# Patient Record
Sex: Female | Born: 1975
Health system: Southern US, Community
[De-identification: ages and names within clinical notes are randomized; demographics above are authoritative.]

## PROBLEM LIST (undated history)

## (undated) DIAGNOSIS — M1990 Unspecified osteoarthritis, unspecified site: Secondary | ICD-10-CM

## (undated) DIAGNOSIS — Z5189 Encounter for other specified aftercare: Secondary | ICD-10-CM

## (undated) DIAGNOSIS — F419 Anxiety disorder, unspecified: Secondary | ICD-10-CM

## (undated) HISTORY — PX: LUMBAR FUSION: SHX111

## (undated) HISTORY — DX: Encounter for other specified aftercare: Z51.89

## (undated) HISTORY — DX: Unspecified osteoarthritis, unspecified site: M19.90

---

## 1996-10-21 HISTORY — PX: LUMBAR DISC SURGERY: SHX700

## 1998-04-25 ENCOUNTER — Inpatient Hospital Stay (HOSPITAL_COMMUNITY): Admission: AD | Admit: 1998-04-25 | Discharge: 1998-04-25 | Payer: Self-pay | Admitting: Obstetrics and Gynecology

## 1998-05-20 ENCOUNTER — Inpatient Hospital Stay (HOSPITAL_COMMUNITY): Admission: AD | Admit: 1998-05-20 | Discharge: 1998-05-20 | Payer: Self-pay | Admitting: Obstetrics and Gynecology

## 1998-06-09 ENCOUNTER — Inpatient Hospital Stay (HOSPITAL_COMMUNITY): Admission: AD | Admit: 1998-06-09 | Discharge: 1998-06-11 | Payer: Self-pay | Admitting: Obstetrics and Gynecology

## 1998-06-11 ENCOUNTER — Encounter (HOSPITAL_COMMUNITY): Admission: RE | Admit: 1998-06-11 | Discharge: 1998-07-17 | Payer: Self-pay | Admitting: Obstetrics and Gynecology

## 1998-07-19 ENCOUNTER — Other Ambulatory Visit: Admission: RE | Admit: 1998-07-19 | Discharge: 1998-07-19 | Payer: Self-pay | Admitting: Obstetrics and Gynecology

## 1999-01-23 ENCOUNTER — Ambulatory Visit (HOSPITAL_COMMUNITY): Admission: RE | Admit: 1999-01-23 | Discharge: 1999-01-23 | Payer: Self-pay | Admitting: Neurological Surgery

## 1999-01-23 ENCOUNTER — Encounter: Payer: Self-pay | Admitting: Neurological Surgery

## 1999-02-01 ENCOUNTER — Ambulatory Visit (HOSPITAL_COMMUNITY): Admission: RE | Admit: 1999-02-01 | Discharge: 1999-02-01 | Payer: Self-pay | Admitting: Neurological Surgery

## 1999-02-01 ENCOUNTER — Encounter: Payer: Self-pay | Admitting: Neurological Surgery

## 1999-02-15 ENCOUNTER — Ambulatory Visit (HOSPITAL_COMMUNITY): Admission: RE | Admit: 1999-02-15 | Discharge: 1999-02-15 | Payer: Self-pay | Admitting: Neurological Surgery

## 1999-02-15 ENCOUNTER — Encounter: Payer: Self-pay | Admitting: Neurological Surgery

## 1999-03-09 ENCOUNTER — Encounter: Payer: Self-pay | Admitting: Neurological Surgery

## 1999-03-09 ENCOUNTER — Ambulatory Visit (HOSPITAL_COMMUNITY): Admission: RE | Admit: 1999-03-09 | Discharge: 1999-03-09 | Payer: Self-pay | Admitting: Neurological Surgery

## 1999-03-15 ENCOUNTER — Ambulatory Visit (HOSPITAL_COMMUNITY): Admission: EM | Admit: 1999-03-15 | Discharge: 1999-03-15 | Payer: Self-pay | Admitting: Anesthesiology

## 1999-10-02 ENCOUNTER — Other Ambulatory Visit: Admission: RE | Admit: 1999-10-02 | Discharge: 1999-10-02 | Payer: Self-pay | Admitting: Obstetrics and Gynecology

## 1999-12-22 ENCOUNTER — Emergency Department (HOSPITAL_COMMUNITY): Admission: EM | Admit: 1999-12-22 | Discharge: 1999-12-22 | Payer: Self-pay | Admitting: Emergency Medicine

## 1999-12-22 ENCOUNTER — Encounter: Payer: Self-pay | Admitting: Emergency Medicine

## 2000-09-23 ENCOUNTER — Other Ambulatory Visit: Admission: RE | Admit: 2000-09-23 | Discharge: 2000-09-23 | Payer: Self-pay | Admitting: Obstetrics and Gynecology

## 2001-01-25 ENCOUNTER — Encounter: Payer: Self-pay | Admitting: Obstetrics and Gynecology

## 2001-01-25 ENCOUNTER — Observation Stay (HOSPITAL_COMMUNITY): Admission: AD | Admit: 2001-01-25 | Discharge: 2001-01-26 | Payer: Self-pay | Admitting: Obstetrics and Gynecology

## 2001-01-28 ENCOUNTER — Inpatient Hospital Stay (HOSPITAL_COMMUNITY): Admission: AD | Admit: 2001-01-28 | Discharge: 2001-01-28 | Payer: Self-pay | Admitting: Obstetrics and Gynecology

## 2001-03-31 ENCOUNTER — Inpatient Hospital Stay (HOSPITAL_COMMUNITY): Admission: AD | Admit: 2001-03-31 | Discharge: 2001-03-31 | Payer: Self-pay | Admitting: *Deleted

## 2001-04-03 ENCOUNTER — Encounter: Payer: Self-pay | Admitting: Obstetrics and Gynecology

## 2001-04-03 ENCOUNTER — Inpatient Hospital Stay (HOSPITAL_COMMUNITY): Admission: AD | Admit: 2001-04-03 | Discharge: 2001-04-03 | Payer: Self-pay | Admitting: Obstetrics and Gynecology

## 2001-04-04 ENCOUNTER — Inpatient Hospital Stay (HOSPITAL_COMMUNITY): Admission: AD | Admit: 2001-04-04 | Discharge: 2001-04-04 | Payer: Self-pay | Admitting: Obstetrics and Gynecology

## 2001-04-16 ENCOUNTER — Inpatient Hospital Stay (HOSPITAL_COMMUNITY): Admission: AD | Admit: 2001-04-16 | Discharge: 2001-04-18 | Payer: Self-pay | Admitting: Obstetrics & Gynecology

## 2001-04-19 ENCOUNTER — Encounter: Admission: RE | Admit: 2001-04-19 | Discharge: 2001-05-19 | Payer: Self-pay | Admitting: Obstetrics and Gynecology

## 2001-09-10 ENCOUNTER — Other Ambulatory Visit: Admission: RE | Admit: 2001-09-10 | Discharge: 2001-09-10 | Payer: Self-pay | Admitting: Obstetrics and Gynecology

## 2002-09-28 ENCOUNTER — Other Ambulatory Visit: Admission: RE | Admit: 2002-09-28 | Discharge: 2002-09-28 | Payer: Self-pay | Admitting: Obstetrics and Gynecology

## 2002-10-20 ENCOUNTER — Ambulatory Visit (HOSPITAL_COMMUNITY): Admission: RE | Admit: 2002-10-20 | Discharge: 2002-10-20 | Payer: Self-pay | Admitting: Obstetrics and Gynecology

## 2002-10-20 ENCOUNTER — Encounter: Payer: Self-pay | Admitting: Obstetrics and Gynecology

## 2002-11-09 ENCOUNTER — Encounter (INDEPENDENT_AMBULATORY_CARE_PROVIDER_SITE_OTHER): Payer: Self-pay | Admitting: Specialist

## 2002-11-09 ENCOUNTER — Encounter: Payer: Self-pay | Admitting: Surgery

## 2002-11-09 ENCOUNTER — Ambulatory Visit (HOSPITAL_COMMUNITY): Admission: RE | Admit: 2002-11-09 | Discharge: 2002-11-09 | Payer: Self-pay | Admitting: Surgery

## 2003-05-10 ENCOUNTER — Ambulatory Visit (HOSPITAL_COMMUNITY): Admission: RE | Admit: 2003-05-10 | Discharge: 2003-05-10 | Payer: Self-pay | Admitting: Surgery

## 2003-05-10 ENCOUNTER — Encounter: Payer: Self-pay | Admitting: Surgery

## 2004-09-28 ENCOUNTER — Other Ambulatory Visit: Admission: RE | Admit: 2004-09-28 | Discharge: 2004-09-28 | Payer: Self-pay | Admitting: Obstetrics and Gynecology

## 2005-07-22 ENCOUNTER — Encounter: Admission: RE | Admit: 2005-07-22 | Discharge: 2005-07-22 | Payer: Self-pay | Admitting: Obstetrics and Gynecology

## 2005-09-25 ENCOUNTER — Other Ambulatory Visit: Admission: RE | Admit: 2005-09-25 | Discharge: 2005-09-25 | Payer: Self-pay | Admitting: Obstetrics and Gynecology

## 2006-10-16 ENCOUNTER — Encounter: Admission: RE | Admit: 2006-10-16 | Discharge: 2006-10-16 | Payer: Self-pay | Admitting: Obstetrics and Gynecology

## 2006-10-16 ENCOUNTER — Ambulatory Visit (HOSPITAL_COMMUNITY): Admission: RE | Admit: 2006-10-16 | Discharge: 2006-10-16 | Payer: Self-pay | Admitting: Obstetrics and Gynecology

## 2006-10-16 IMAGING — US US SOFT TISSUE HEAD/NECK
1 series · 14 of 25 positions shown · non-contrast
Comparison: none

CLINICAL DATA: Follow-up left thyroid nodule.
THYROID ULTRASOUND:
TECHNIQUE: Ultrasound examination of the thyroid gland and adjacent soft tissue structures was performed.

[Series 1: unknown · 0.09mm/px · 14 of 42 slices shown]
[im 1/42]
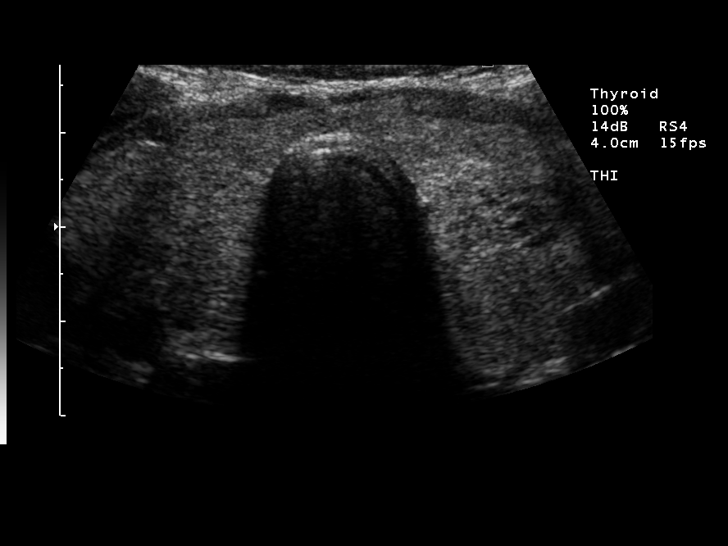
[im 4/42]
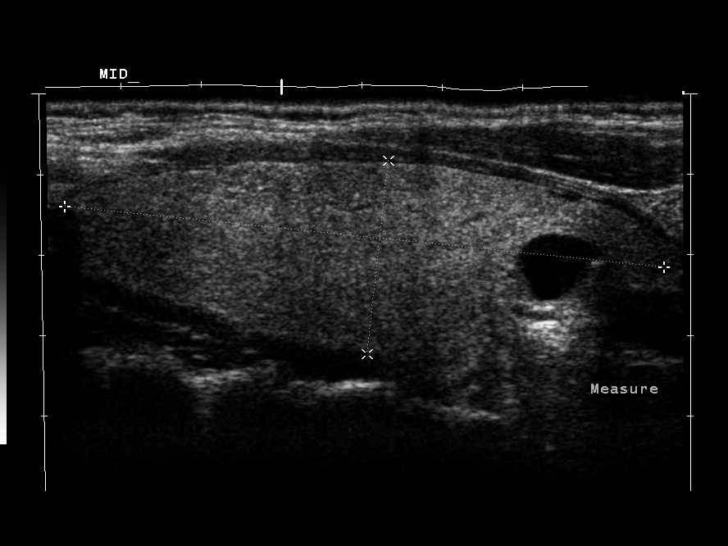
[im 7/42]
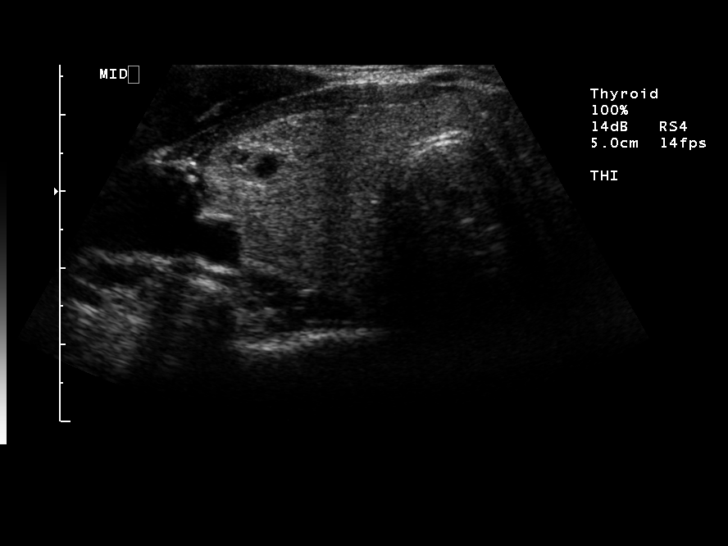
[im 11/42]
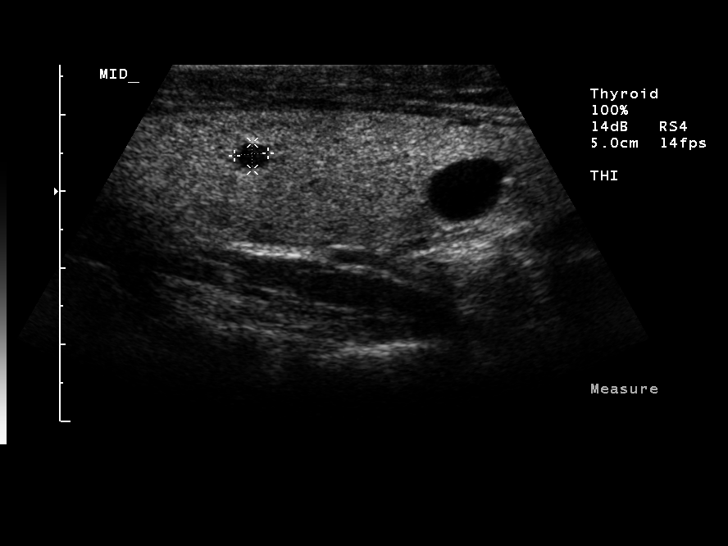
[im 14/42]
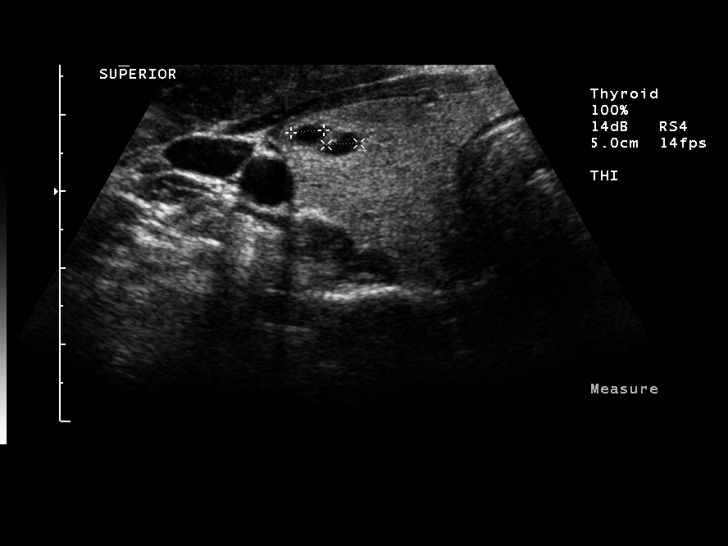
[im 16/42]
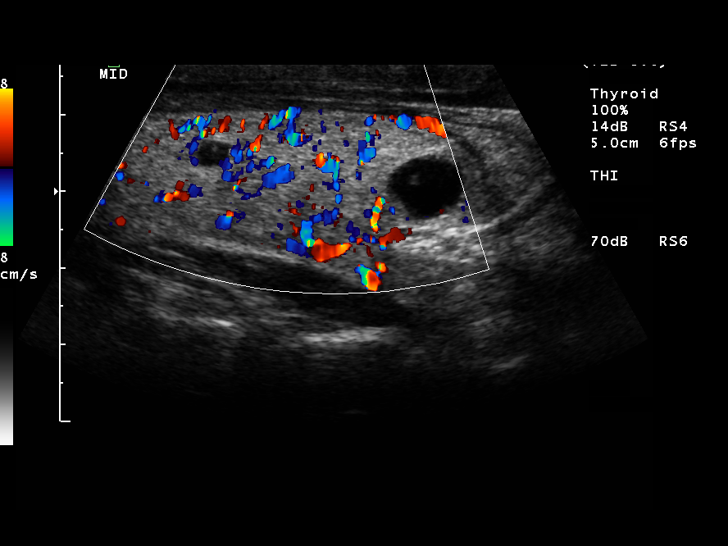
[im 19/42]
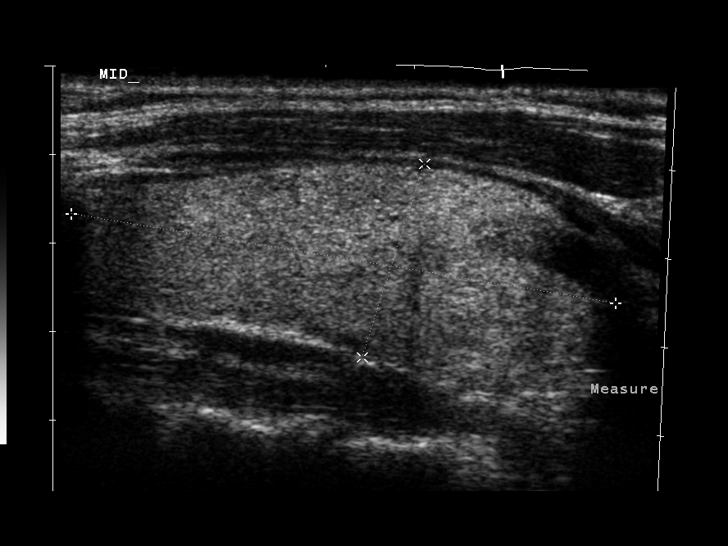
[im 23/42]
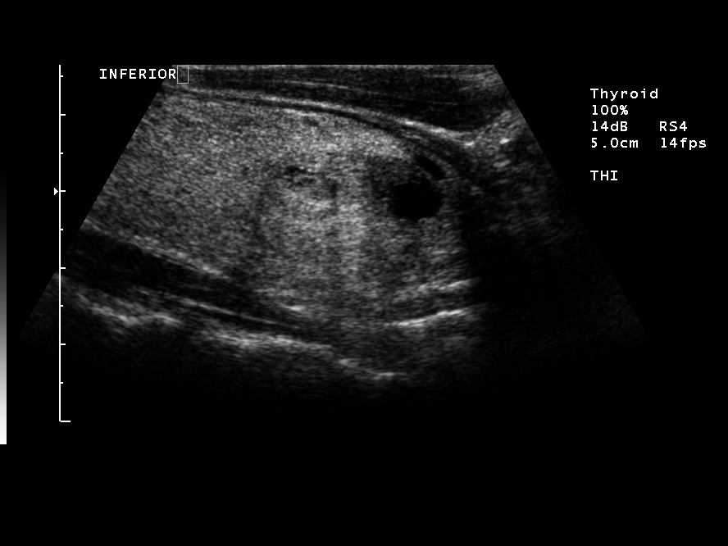
[im 26/42]
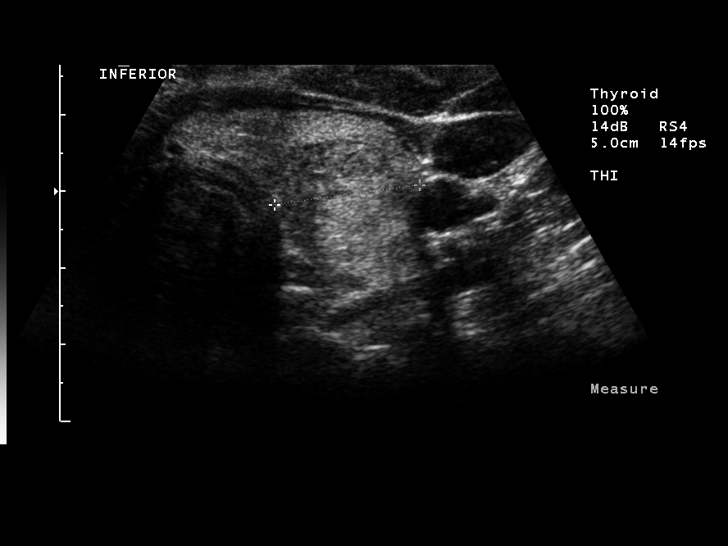
[im 28/42]
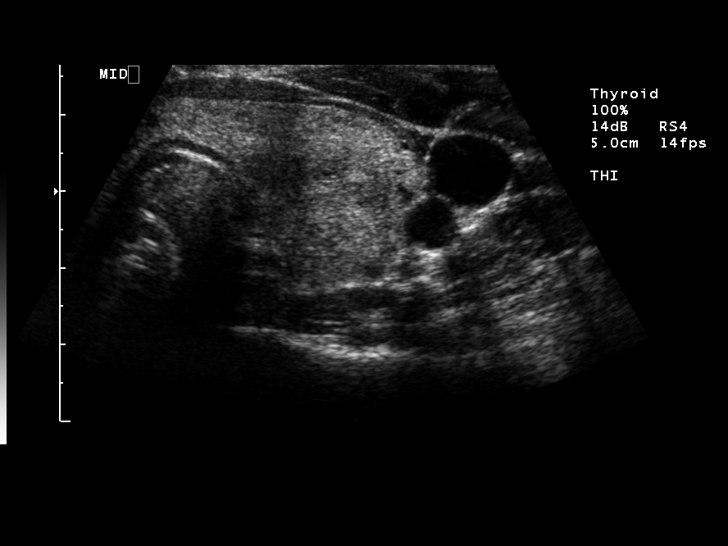
[im 31/42]
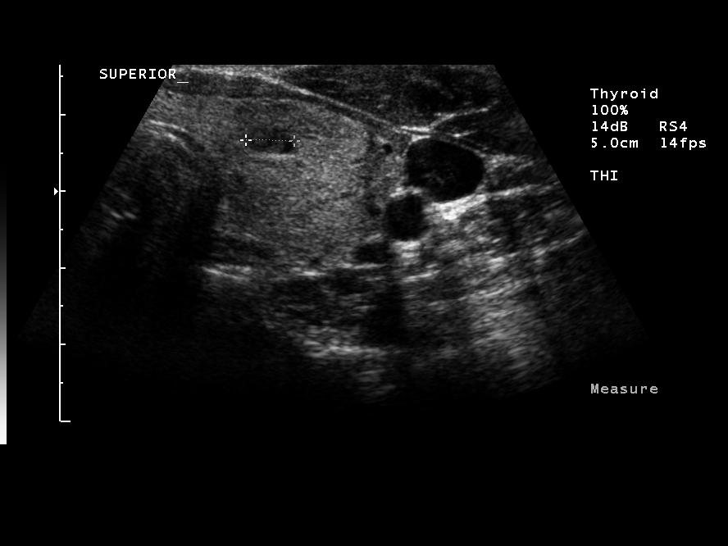
[im 35/42]
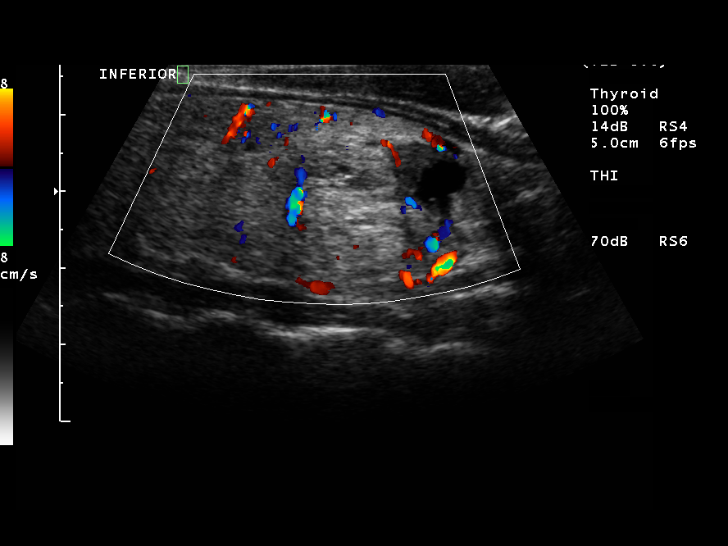
[im 38/42]
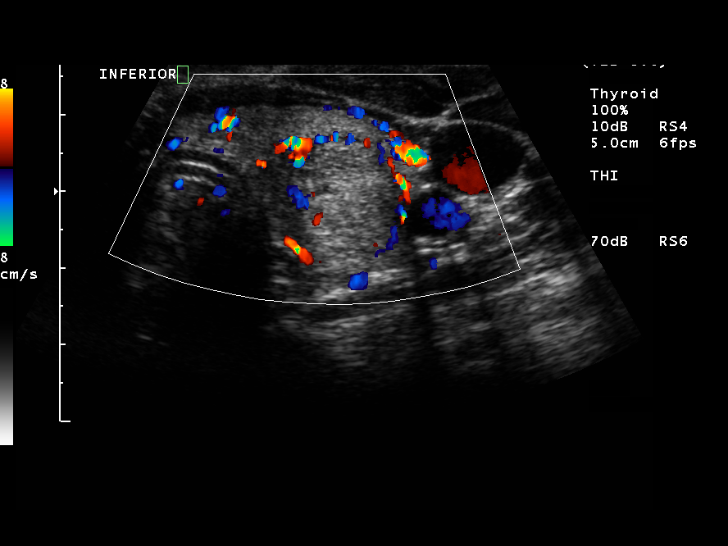
[im 42/42]
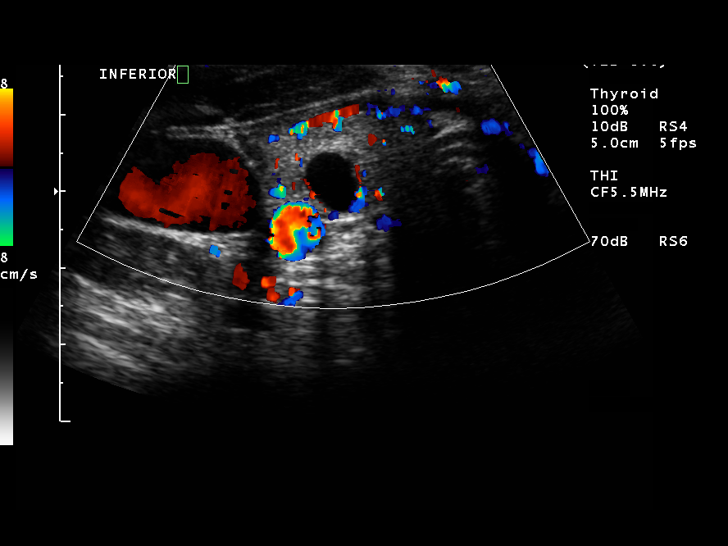

[14 of 25 positions shown; findings below may reference images not displayed]

FINDINGS: Again appreciated is the complex nodule in the inferior pole of the left thyroid lobe which measured 1.3 x 1.6 x 2.6 cm on the [DATE] exam. On today?s examination, the nodule measures 1.9 x 1.9 x 2.9 cm.  The nodule has therefore increased slightly in size.  In the mid aspect of the left thyroid lobe, there is a complex nodule measuring 6 x 6 x 10 mm. Small cystic-like nodules are noted in the right lobe, 2 at the superior pole, 1 measuring 4 x 4 x 5 mm, and the other 4 x 5 x 8 mm.  There is a 4 x 4 mm cystic-like nodule in the medial aspect of the mid right lobe, and also cystic-like nodule in the inferior pole on the right which measures 8 x 8 x 11 mm. The right thyroid lobe measures 7.5 cm in length and 2.4 x 2.5 cm in transverse dimensions.  Left thyroid lobe measures 6.2 cm in length and 2.3 x 2.4 cm in transverse dimensions.  Thyroid isthmus measures 4 mm.
IMPRESSION: Slight increase in size of dominant complex nodule in the left thyroid lobe.

## 2006-10-16 IMAGING — MG MM DIGITAL DIAGNOSTIC BILAT CAD
5 series · 5 of 5 positions shown · non-contrast
Comparison: This is the patient's baseline exam.

DG DIAGNOSTIC BILATERAL
Bilateral CC and MLO view(s) were taken.

BILATERAL BREAST ULTRASOUND
DIGITAL BILATERAL DIAGNOSTIC MAMMOGRAM WITH CAD AND BILATERAL BREAST ULTRASOUND:
CLINICAL DATA: Right breast questioned focal palpable finding in the 9 o'clock location.

[R CC]
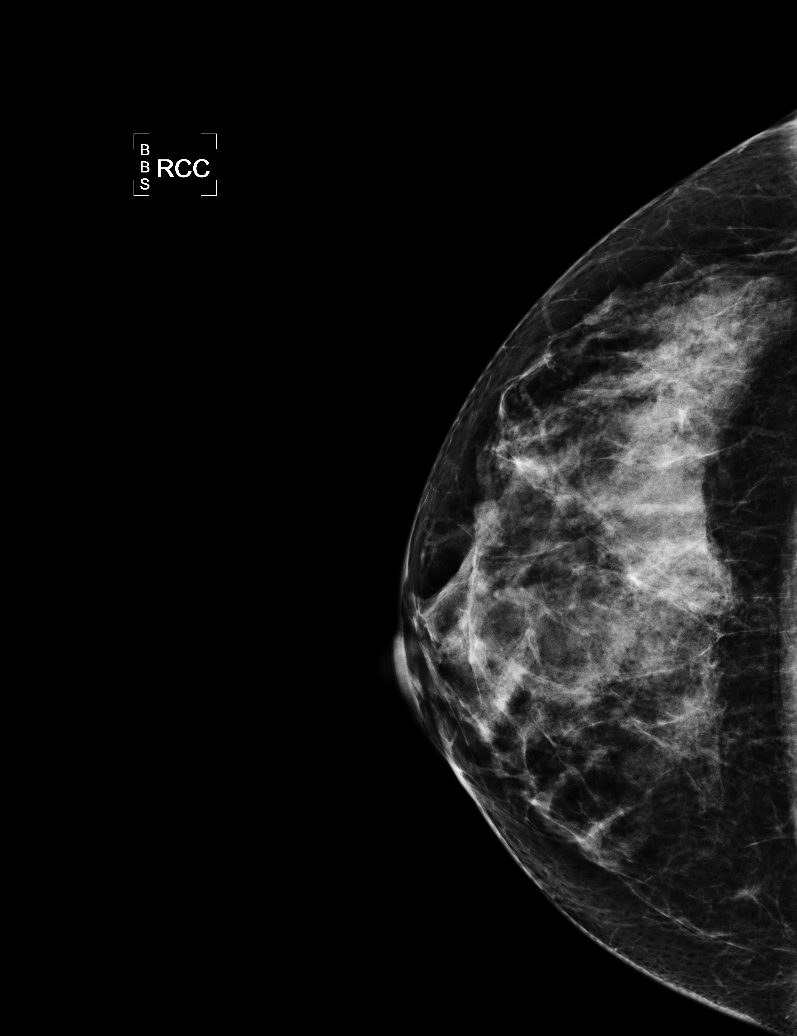

[L CC]
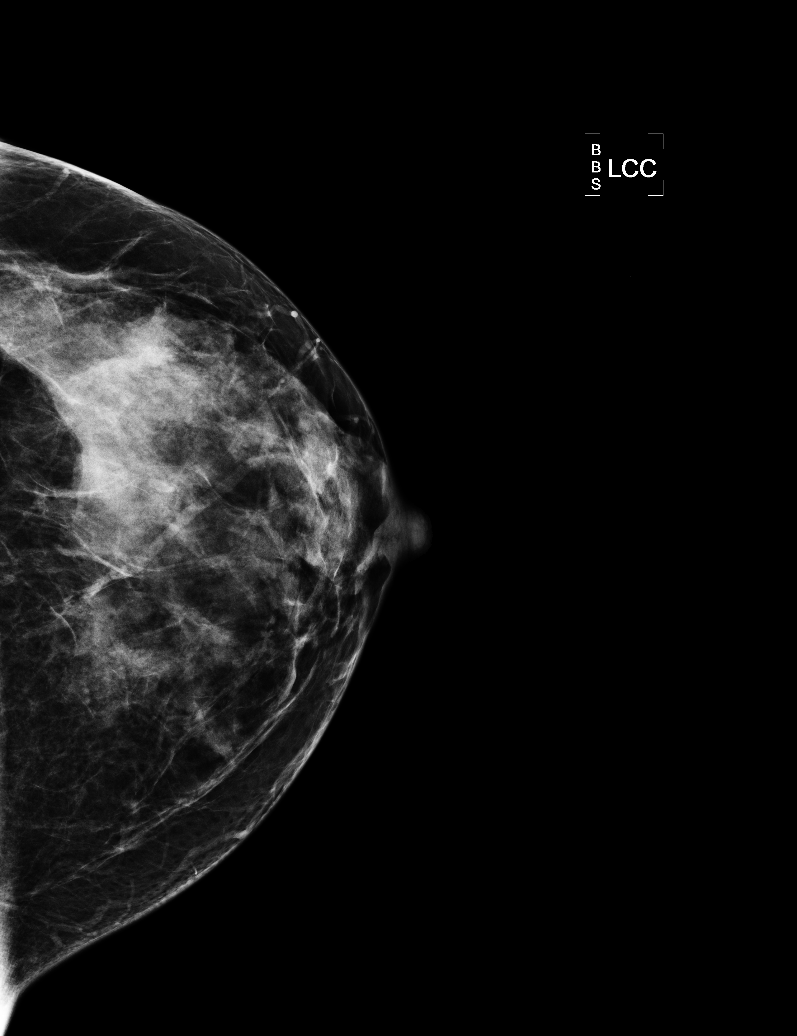

[L MLO]
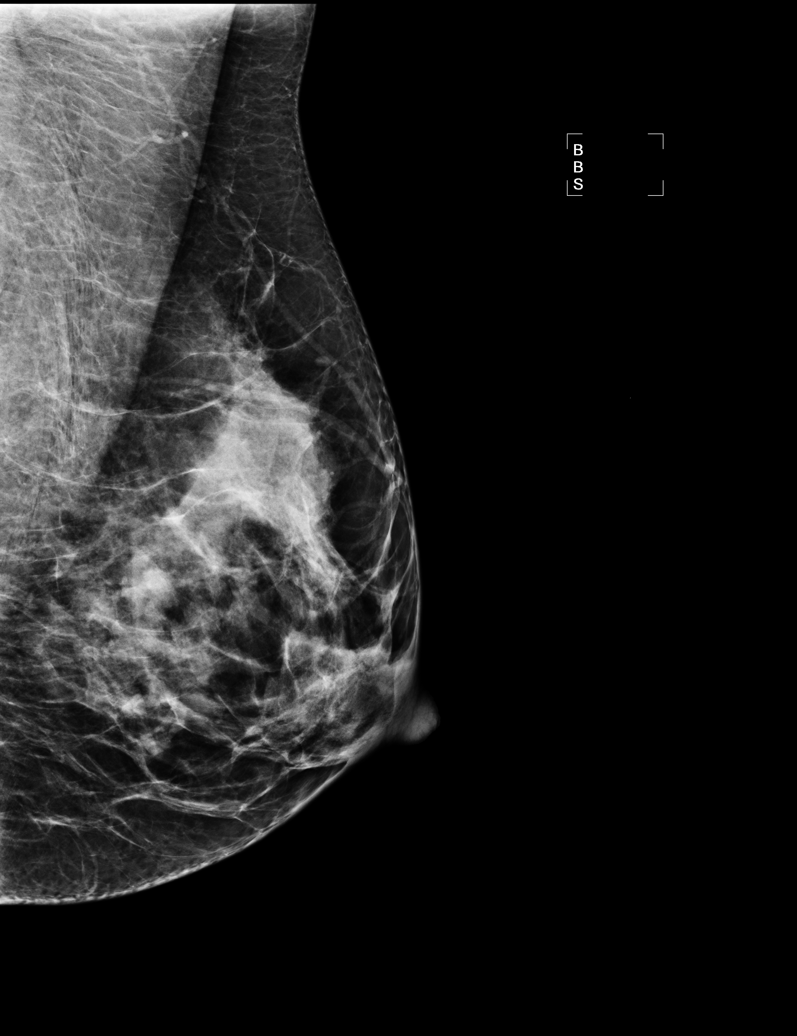

[R MLO (1 of 2)]
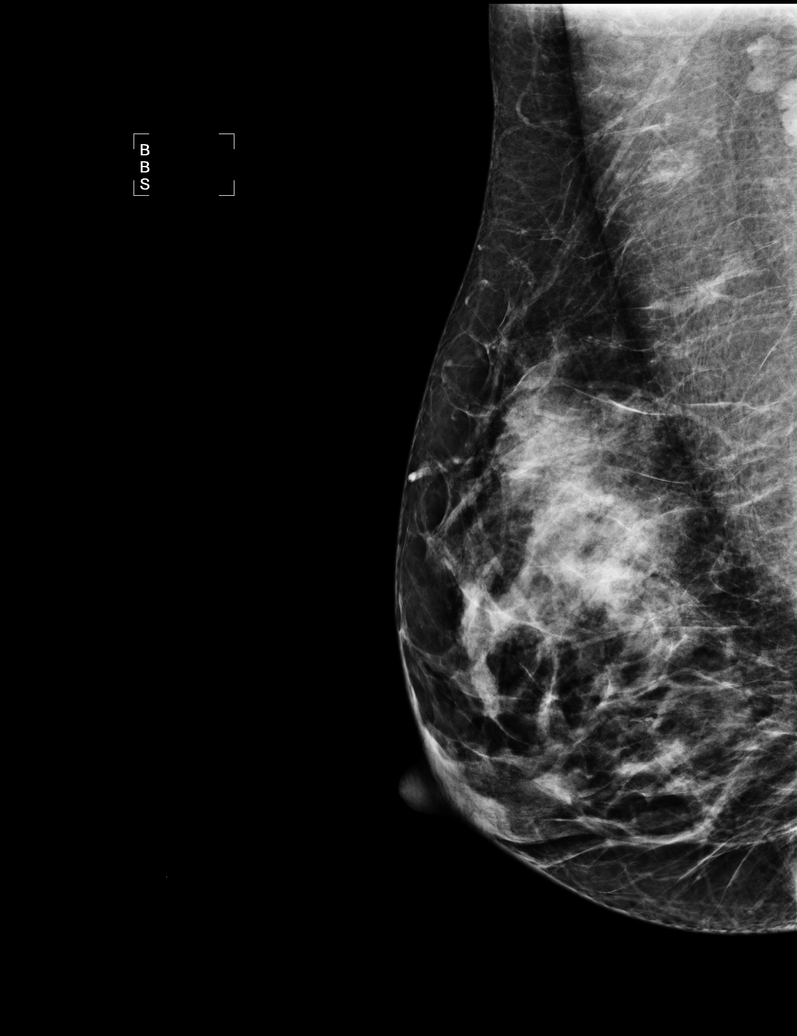

[R MLO (2 of 2)]
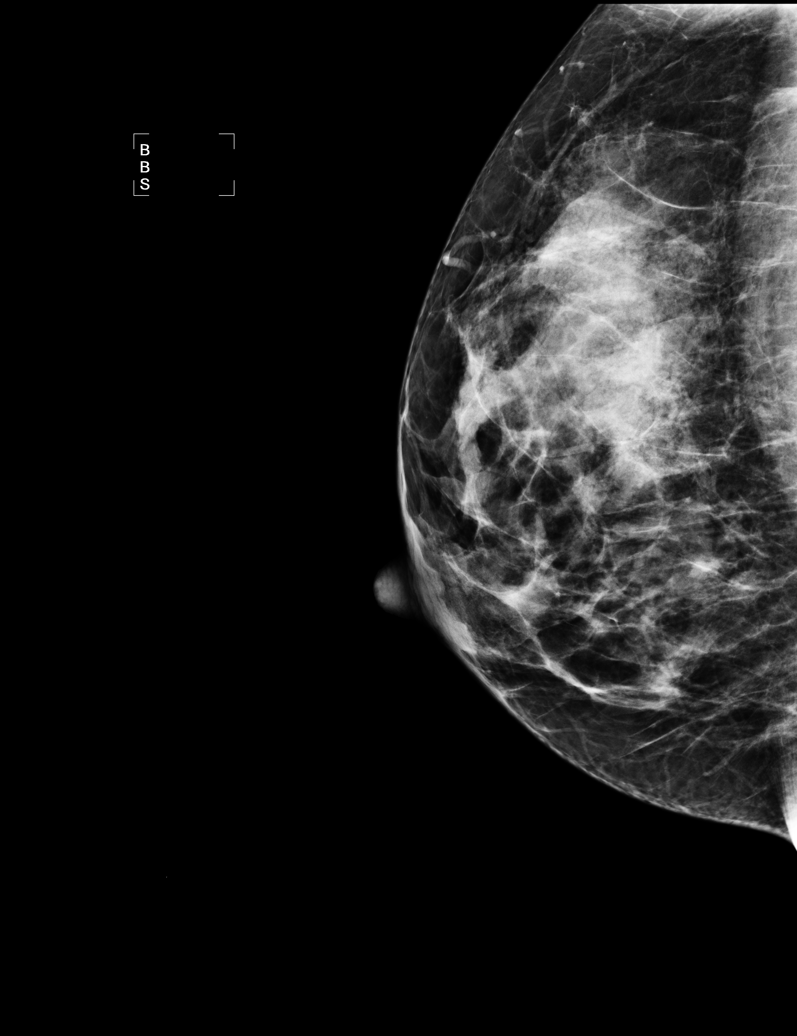

[5 of 5 positions shown; findings below may reference images not displayed]

The breast parenchyma is extremely dense and nodular bilaterally.  This may limit sensitivity of 
mammography.  There is an area of relatively increased nodularity in the left breast at the 3 
o'clock location.  No suspicious mass is definitely identified on the right.

On physical examination, I palpate no distinct abnormality in the right breast at the 9 o'clock 
location; ultrasound of this area is normal.

Ultrasound of the left breast at the 3 o'clock location demonstrates nodular appearing glandular 
tissue but no focal mass or distortion.
IMPRESSION: No mammographic or sonographic evidence for malignancy on the right side in the area of the 
patient's reported questioned palpable finding.

Probably benign asymmetric glandular tissue in the left breast laterally, amenable to 6 month 
diagnostic mammogram follow-up.  If at that time these findings remain stable, likely the patient 
would require no further specific follow-up until the age of 40.

ASSESSMENT: Probably benign - BI-RADS 3

Mammogram of the left breast in 6 months.
ANALYZED BY COMPUTER AIDED DETECTION. ,

## 2006-10-16 IMAGING — MG MM DIGITAL DIAGNOSTIC BILAT CAD
4 series · 4 of 4 positions shown · non-contrast
Comparison: This is the patient's baseline exam.

DG DIAGNOSTIC BILATERAL
Bilateral CC and MLO view(s) were taken.

BILATERAL BREAST ULTRASOUND
DIGITAL BILATERAL DIAGNOSTIC MAMMOGRAM WITH CAD AND BILATERAL BREAST ULTRASOUND:
CLINICAL DATA: Right breast questioned focal palpable finding in the 9 o'clock location.

[R CC (1 of 2)]
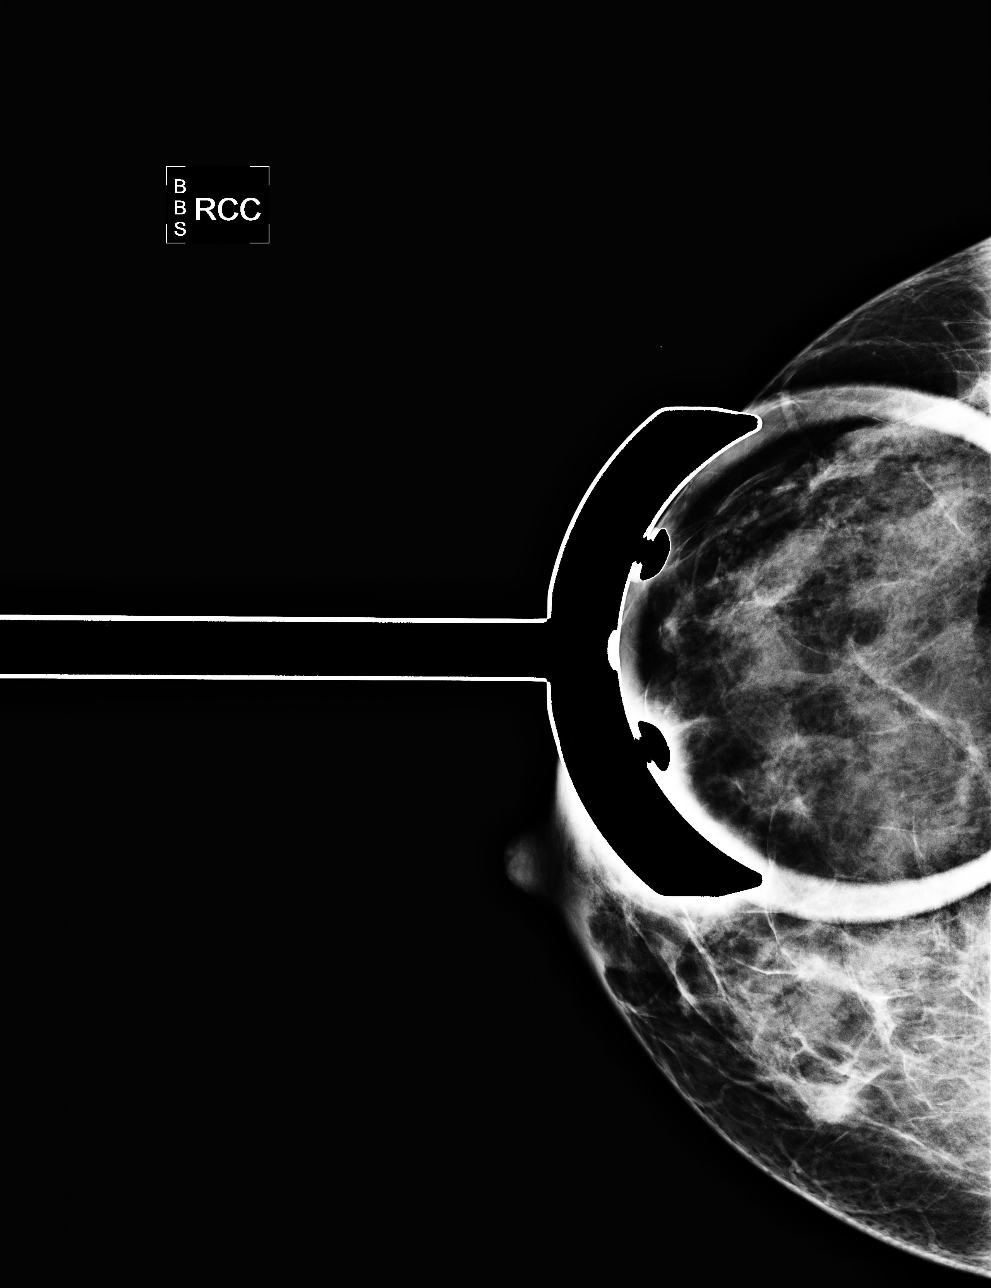

[L MLO]
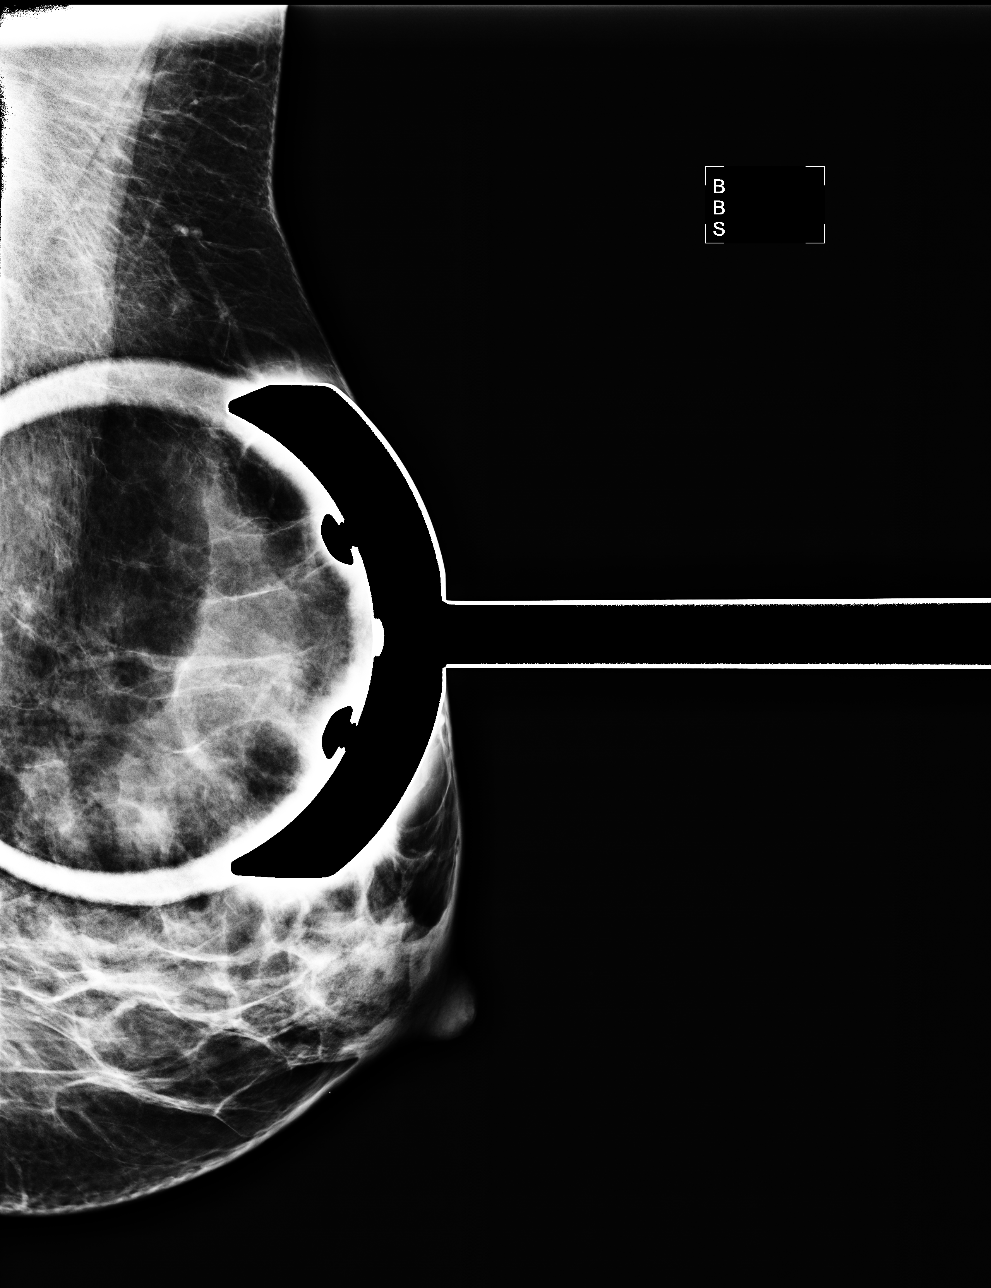

[R CC (2 of 2)]
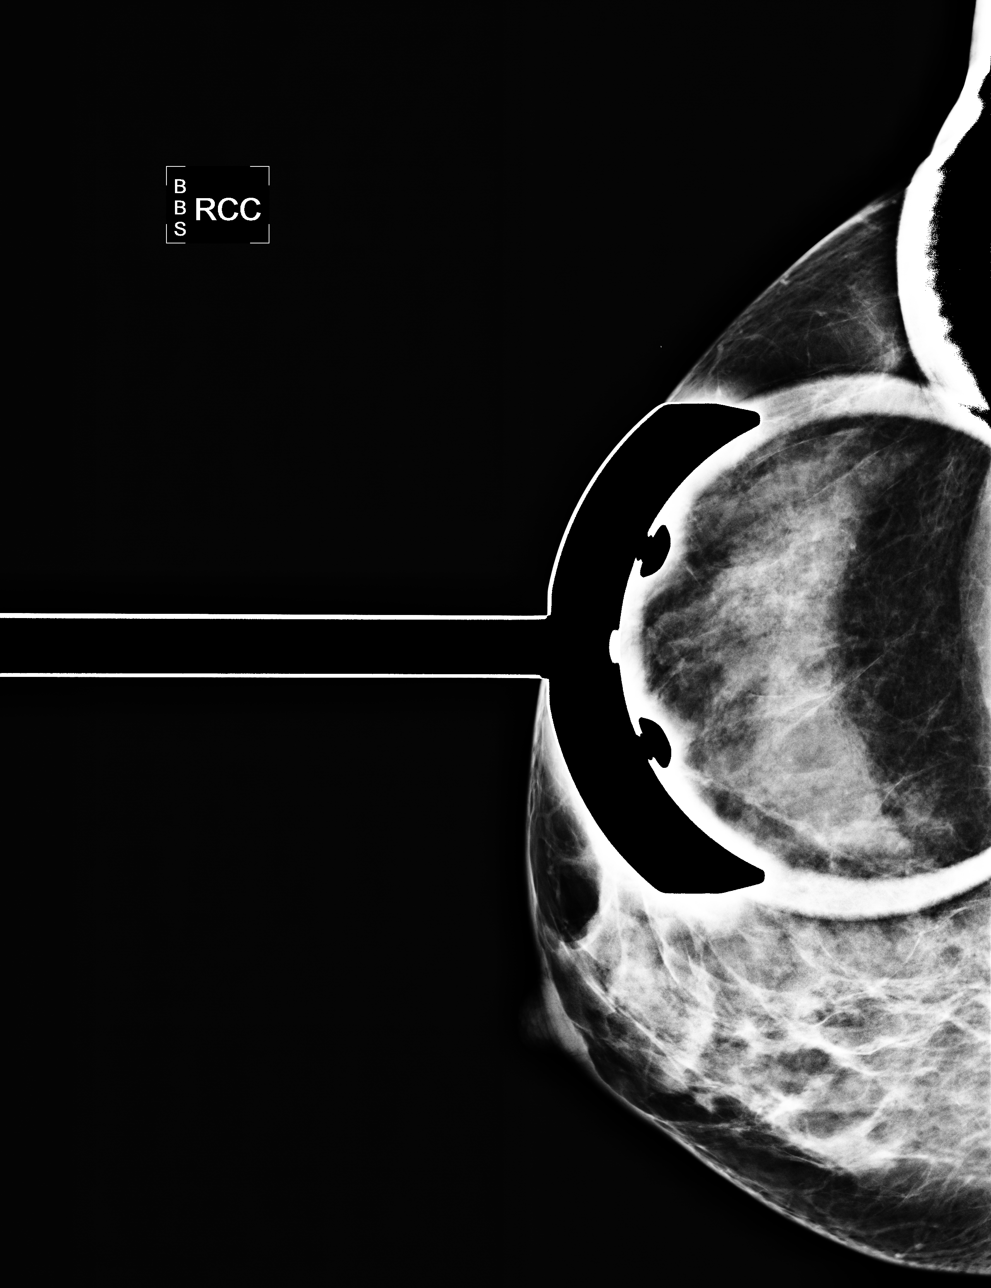

[L CC]
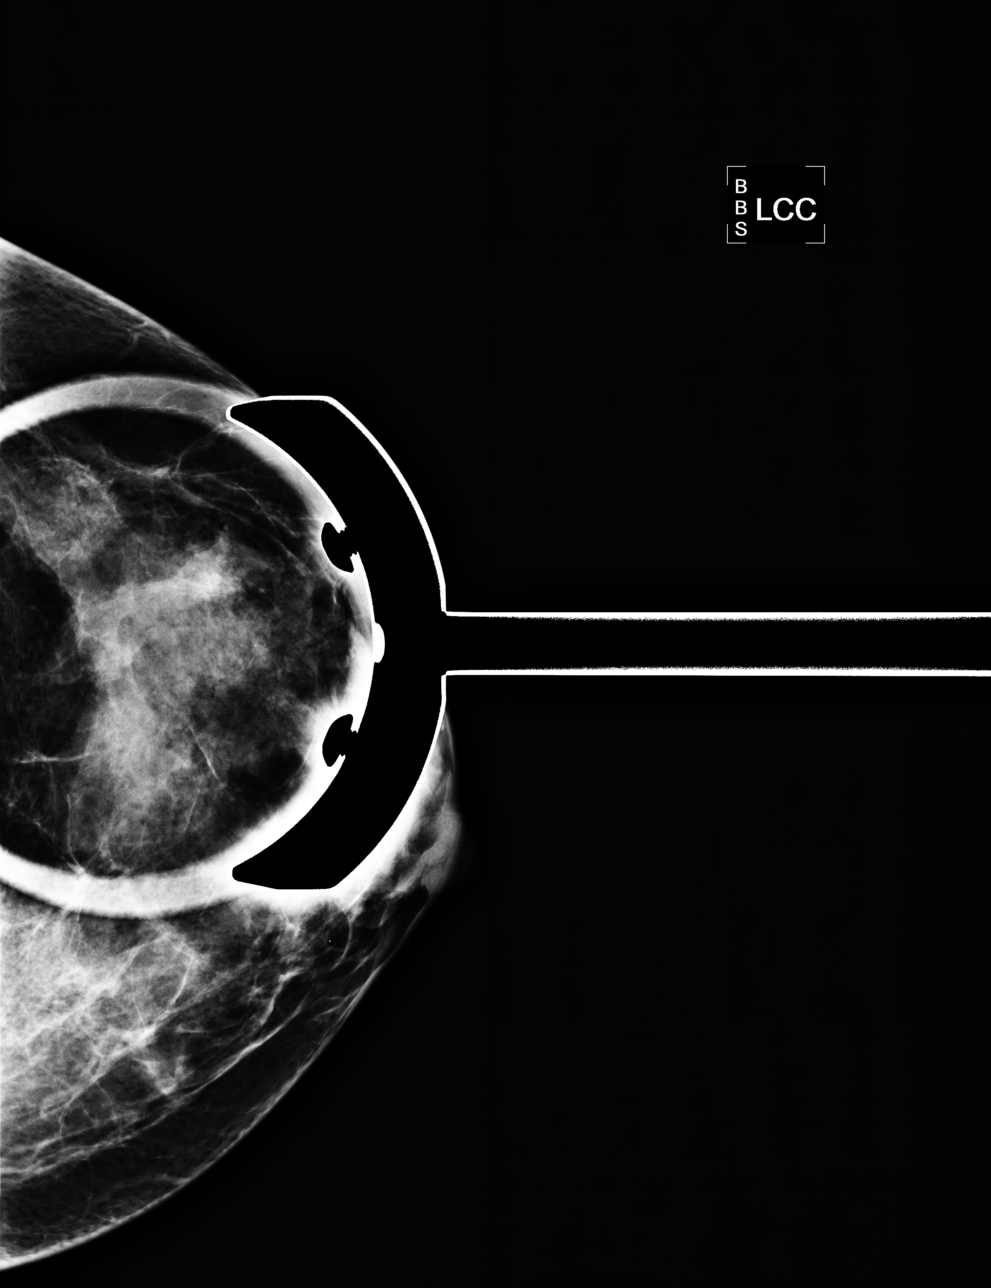

[4 of 4 positions shown; findings below may reference images not displayed]

The breast parenchyma is extremely dense and nodular bilaterally.  This may limit sensitivity of 
mammography.  There is an area of relatively increased nodularity in the left breast at the 3 
o'clock location.  No suspicious mass is definitely identified on the right.

On physical examination, I palpate no distinct abnormality in the right breast at the 9 o'clock 
location; ultrasound of this area is normal.

Ultrasound of the left breast at the 3 o'clock location demonstrates nodular appearing glandular 
tissue but no focal mass or distortion.
IMPRESSION: No mammographic or sonographic evidence for malignancy on the right side in the area of the 
patient's reported questioned palpable finding.

Probably benign asymmetric glandular tissue in the left breast laterally, amenable to 6 month 
diagnostic mammogram follow-up.  If at that time these findings remain stable, likely the patient 
would require no further specific follow-up until the age of 40.

ASSESSMENT: Probably benign - BI-RADS 3

Mammogram of the left breast in 6 months.
ANALYZED BY COMPUTER AIDED DETECTION. ,

## 2007-05-05 ENCOUNTER — Ambulatory Visit: Payer: Self-pay | Admitting: Internal Medicine

## 2007-05-06 ENCOUNTER — Ambulatory Visit: Payer: Self-pay | Admitting: Internal Medicine

## 2007-05-06 IMAGING — CT CT LUMBAR SPINE WITHOUT CONTRAST
1 series · 12 of 14 positions shown, 15 images · non-contrast
Comparison: none

REASON FOR EXAM: pain in mid lumbar at the site of previous 2 surgeries
COMMENTS:

[Series 6: soft tissue · axial · 0.31mm/px · z∈[-1033,-849]mm · 12 of 110 slices shown, 15 images]
[im 9/110  soft-tissue]
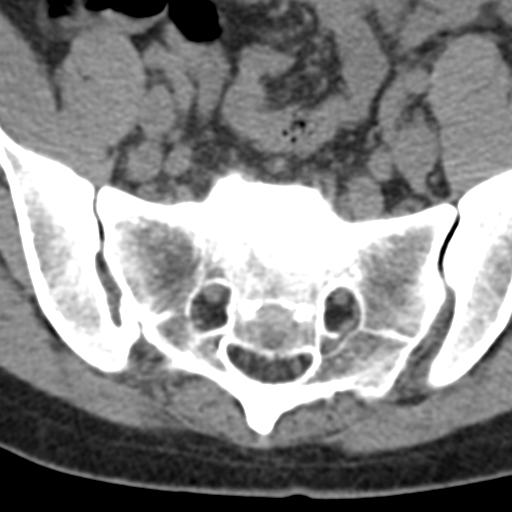
[im 9/110  bone]
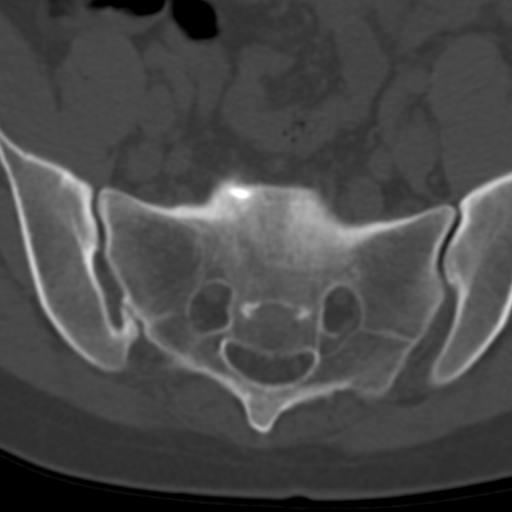
[im 17/110  bone]
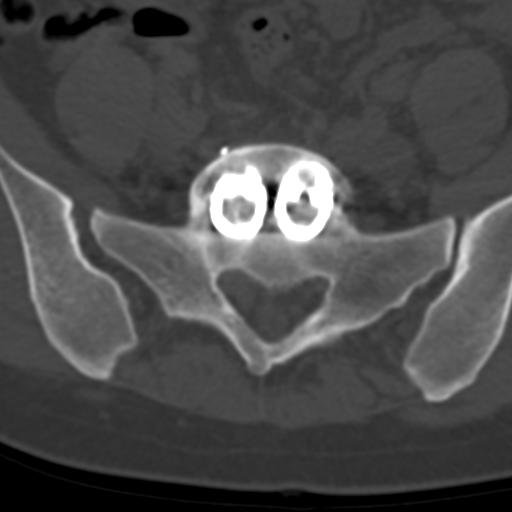
[im 26/110  bone]
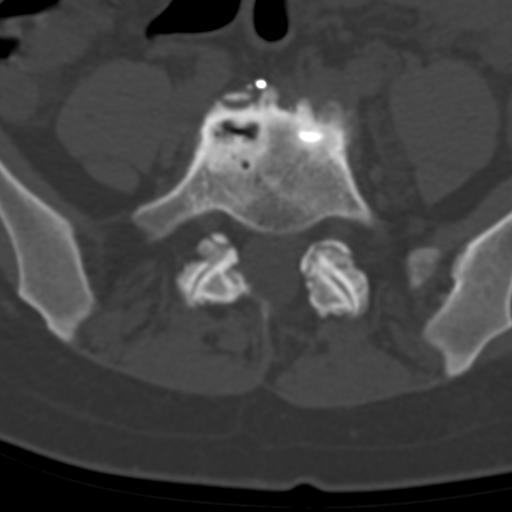
[im 34/110  bone]
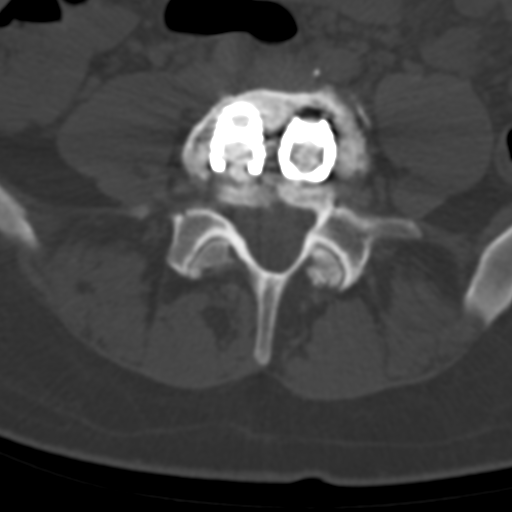
[im 42/110  soft-tissue]
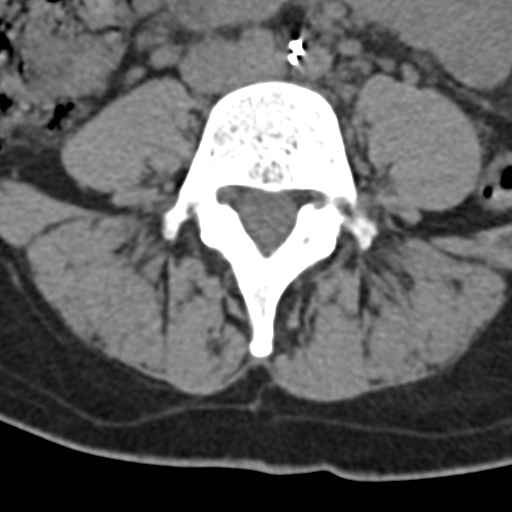
[im 42/110  bone]
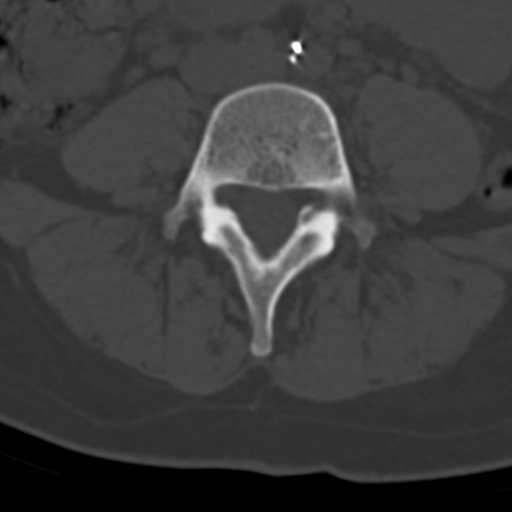
[im 51/110  bone]
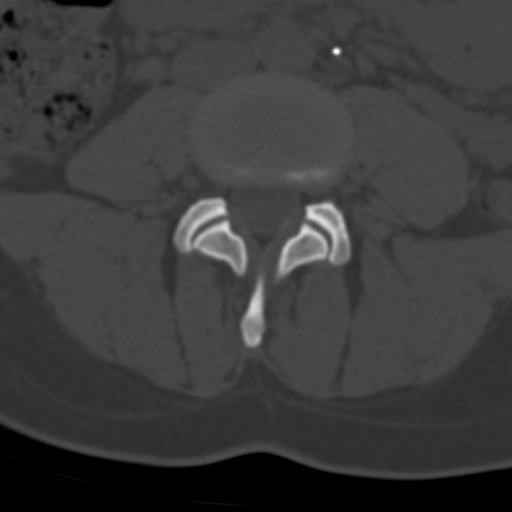
[im 59/110  bone]
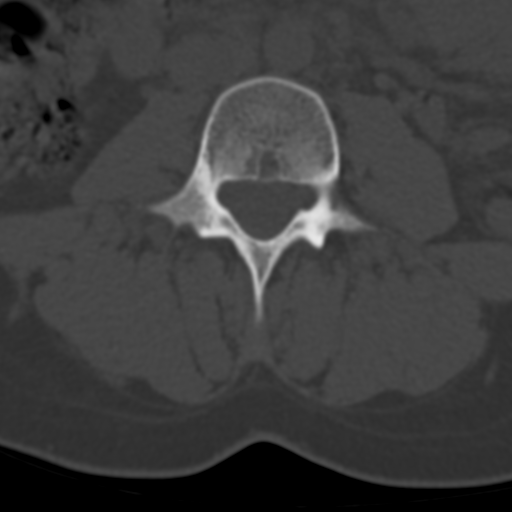
[im 68/110  bone]
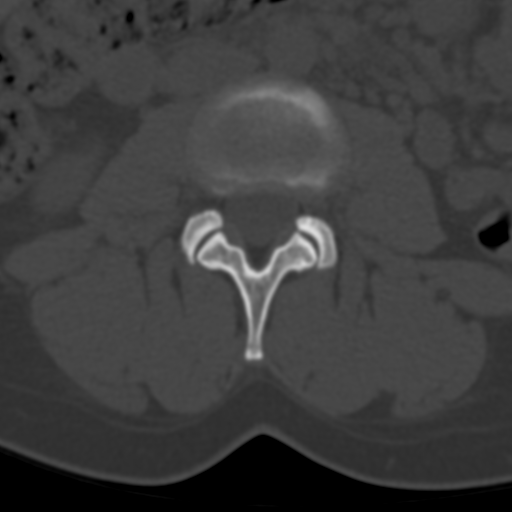
[im 76/110  soft-tissue]
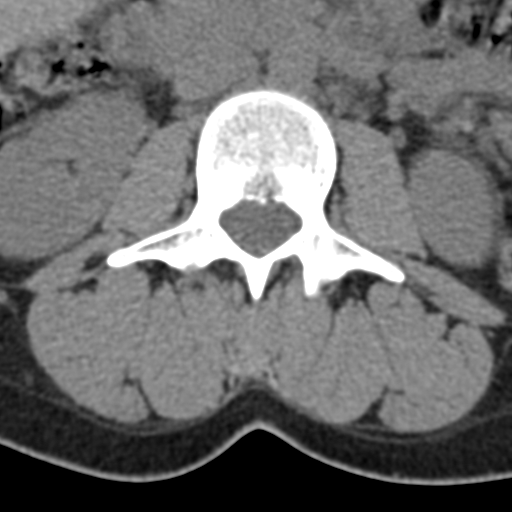
[im 76/110  bone]
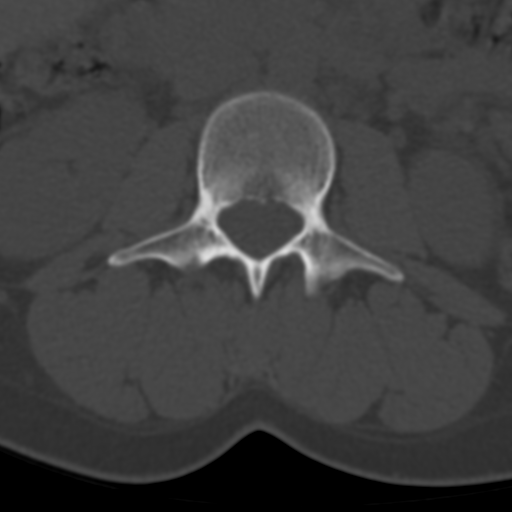
[im 84/110  bone]
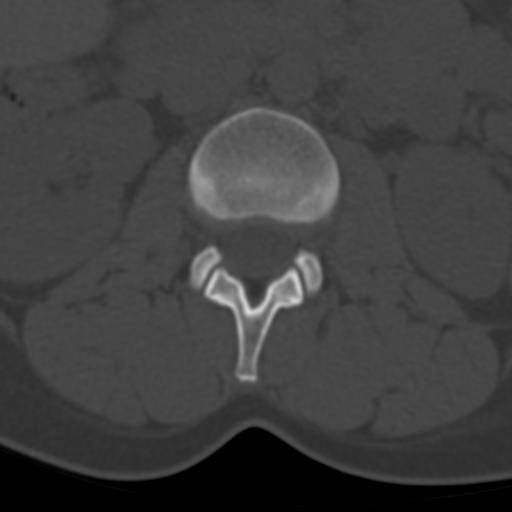
[im 93/110  bone]
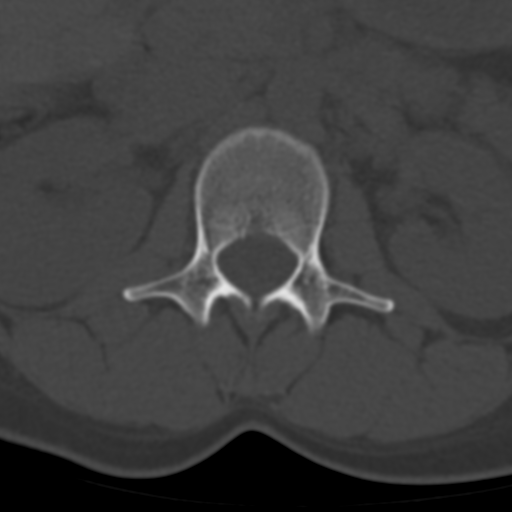
[im 101/110  bone]
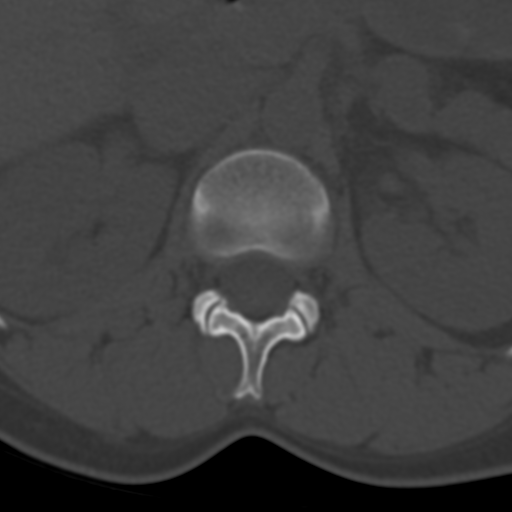

[12 of 14 positions shown; findings below may reference images not displayed]

PROCEDURE:     MALAIKA - MALAIKA LUMBAR SPINE WO  - [DATE] [DATE]

RESULT:     Multislice helical acquisition through the lumbar spine with
axial, sagittal and coronal reconstructions in bone window settings and with
soft tissue reconstructions in the axial plane is submitted for
interpretation. There are changes of intervertebral cages at the L4-L5 and
L5-S1 levels. There is no compression deformity or subluxation. The cortex
appears to be intact. There is no evidence of metastatic or primary bony
malignancy. There is no definite evidence of spinal stenosis although at the
level of L4-L5 and L5-S1 the images demonstrate artifact from the
intervertebral cages. There appears to be a laminectomy defect on the LEFT
at L5.
IMPRESSION: Postoperative changes at L4-L5 and L5-S1. No evidence of metastatic disease.
No definite spinal stenosis evident. There does not appear to be significant
foraminal narrowing evident. MRI would be most sensitive for further
investigation of the spinal canal and foramina, assuming the intervertebral
cages have been in place for an adequate length of time to allow this
procedure.

## 2008-08-17 ENCOUNTER — Encounter: Admission: RE | Admit: 2008-08-17 | Discharge: 2008-08-17 | Payer: Self-pay | Admitting: Obstetrics and Gynecology

## 2008-08-17 IMAGING — MG MM DIGITAL DIAGNOSTIC BILAT CAD
4 series · 4 of 4 positions shown · non-contrast
Comparison: [DATE]

CLINICAL DATA: Reevaluation of probably benign left breast tissue
(asymmetrical glandular tissue noted laterally within the left
breast on prior mammogram dated [DATE]).  The patient failed to
return for her 6-month follow-up evaluation.

DIGITAL DIAGNOSTIC BILATERAL MAMMOGRAM WITH CAD

[R CC]
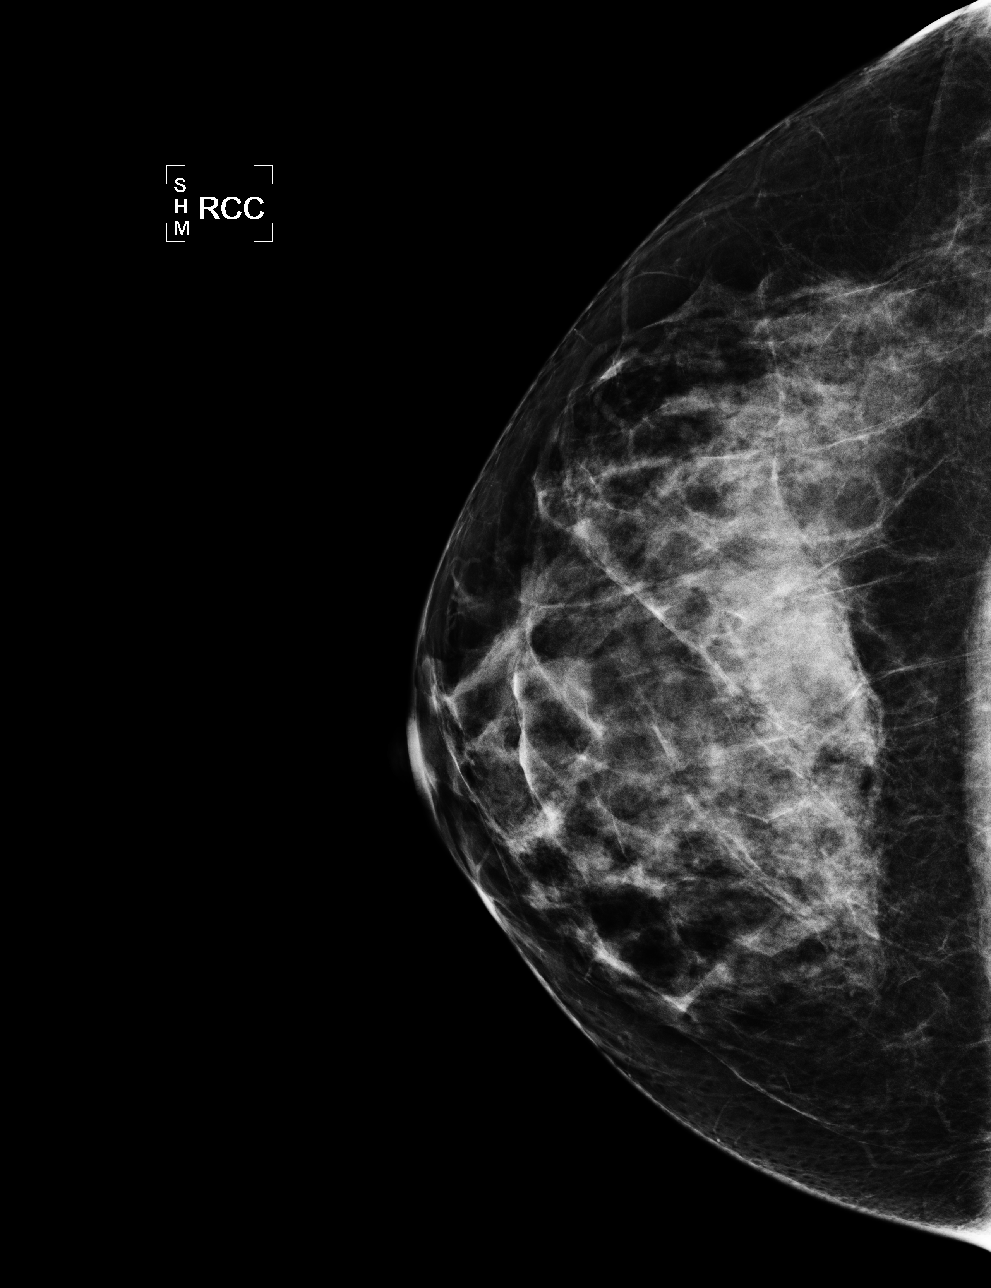

[L CC]
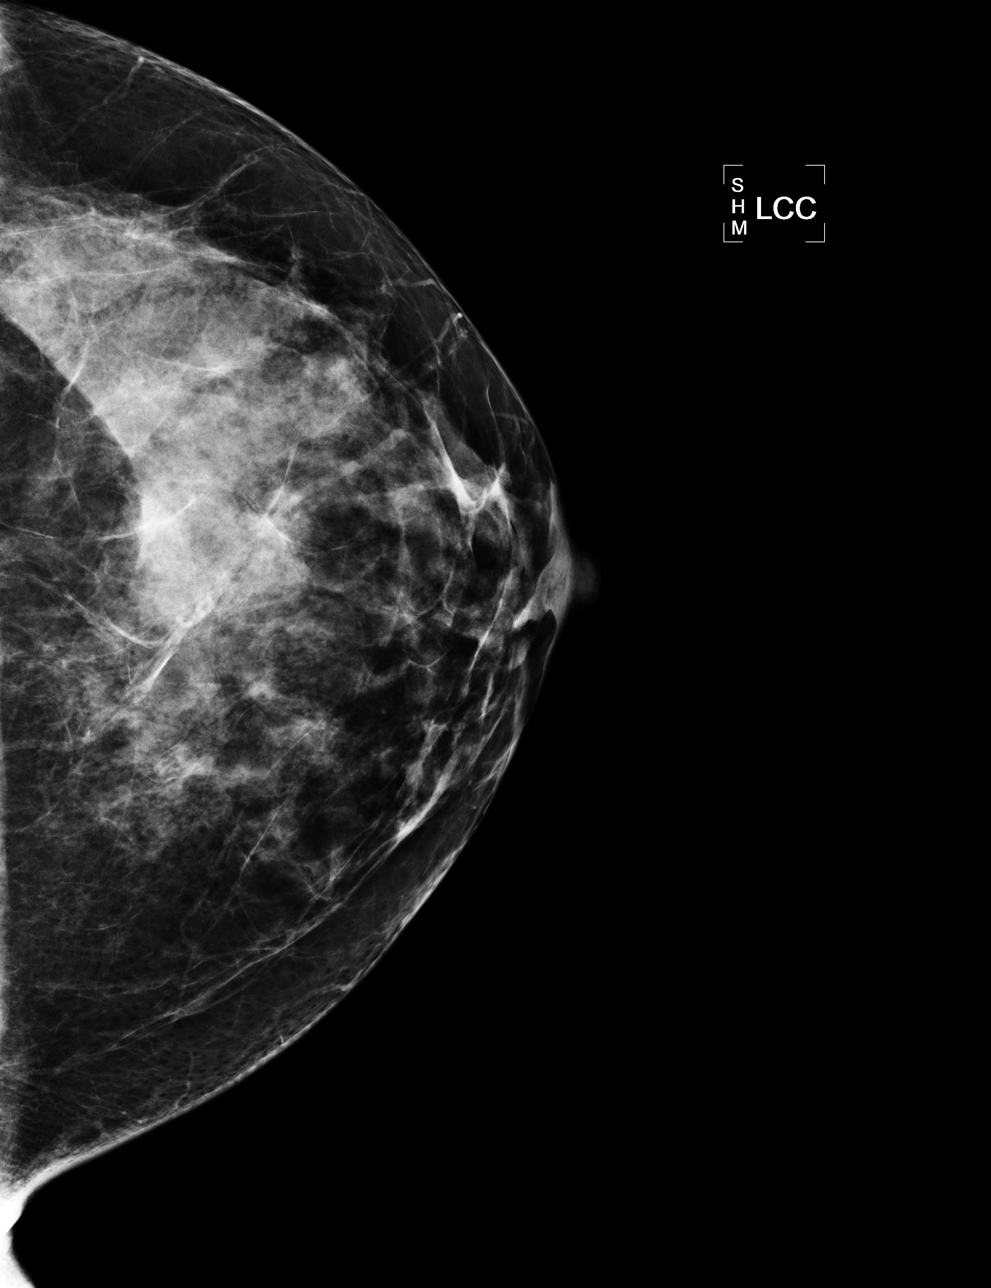

[L MLO]
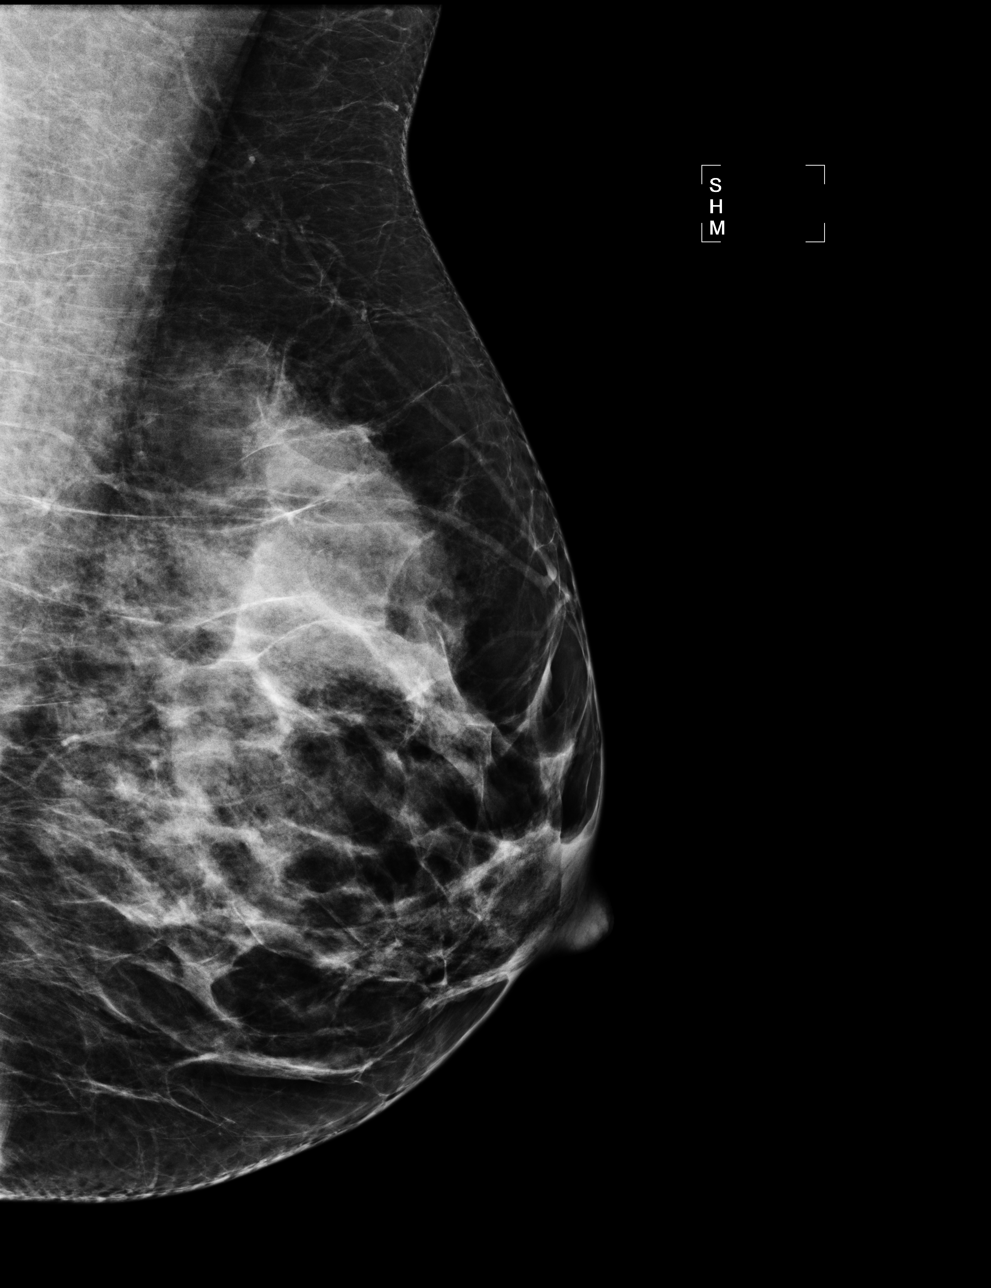

[R MLO]
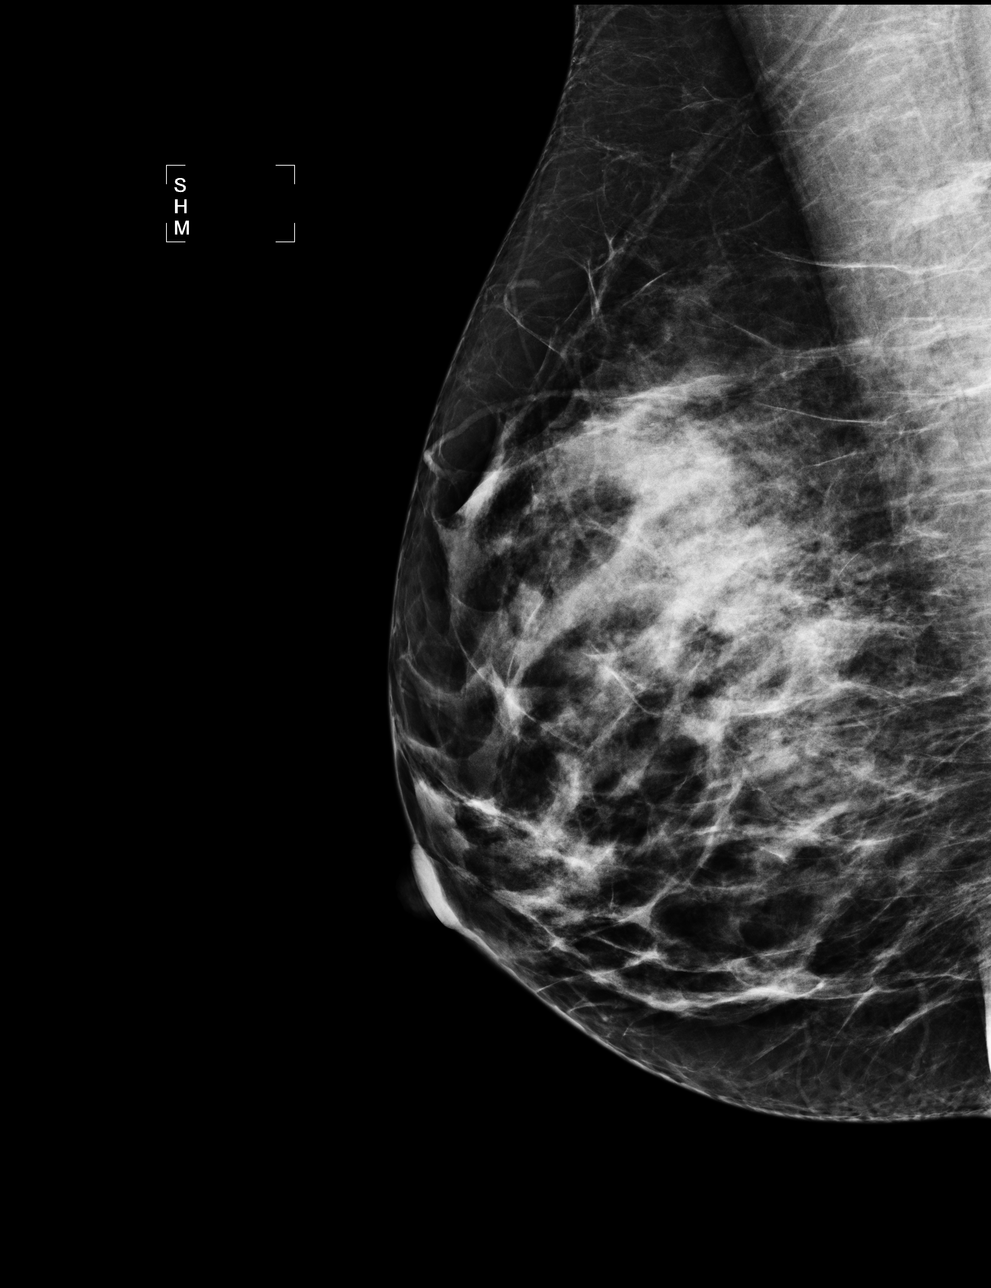

[4 of 4 positions shown; findings below may reference images not displayed]

FINDINGS: There is a scattered fibroglandular breast parenchymal
pattern which is stable.  The breast tissue located laterally
within the left breast is asymmetrical when compared the right side
- but unchanged.  There are no findings worrisome for developing
malignancy within either breast.  There are no areas of distortion
or suspicious microcalcification.
IMPRESSION: Stable breast parenchymal pattern.  No findings worrisome for
malignancy.  Recommend return to screening mammography at age 40.
The importance of breast self-examination was stressed with the
patient.

BI-RADS CATEGORY 1:  Negative.

## 2008-09-07 ENCOUNTER — Ambulatory Visit (HOSPITAL_COMMUNITY): Admission: RE | Admit: 2008-09-07 | Discharge: 2008-09-07 | Payer: Self-pay | Admitting: Obstetrics and Gynecology

## 2008-09-07 IMAGING — US US SOFT TISSUE HEAD/NECK
1 series · 13 of 25 positions shown · non-contrast
Comparison: [DATE]

CLINICAL DATA: Follow-up thyroid nodules

THYROID ULTRASOUND
TECHNIQUE: Ultrasound examination of the thyroid gland and
adjacent soft tissues was performed.

[Series 1: thyroid · 0.11mm/px · 13 of 58 slices shown]
[im 1/58]
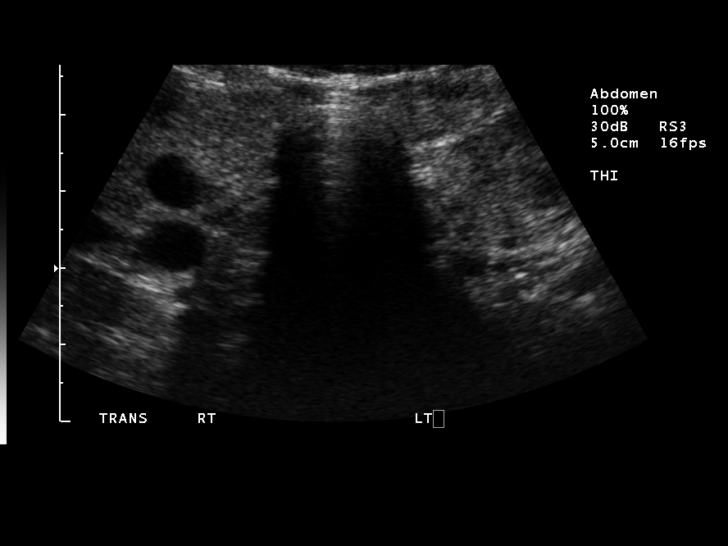
[im 5/58]
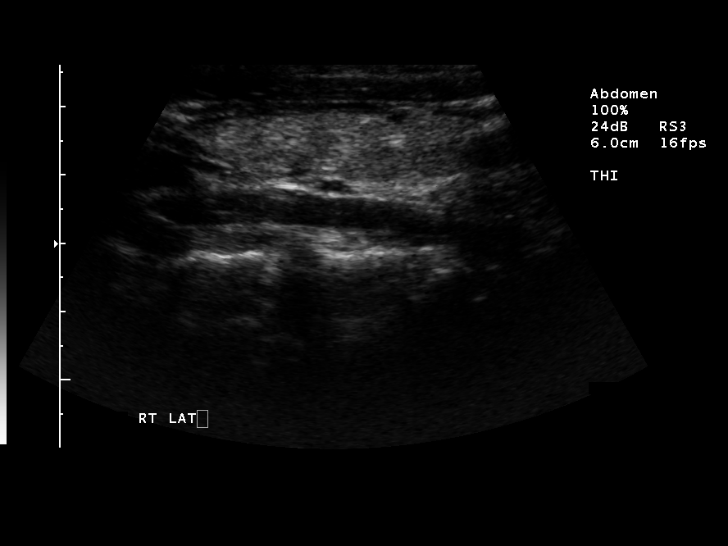
[im 10/58]
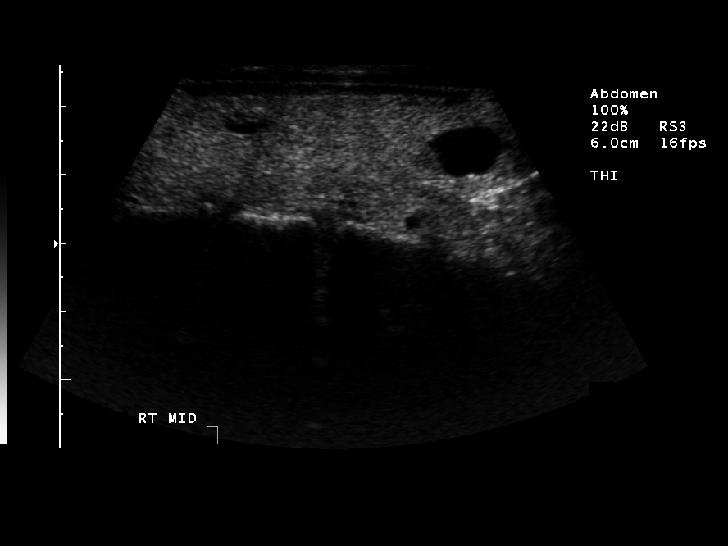
[im 15/58]
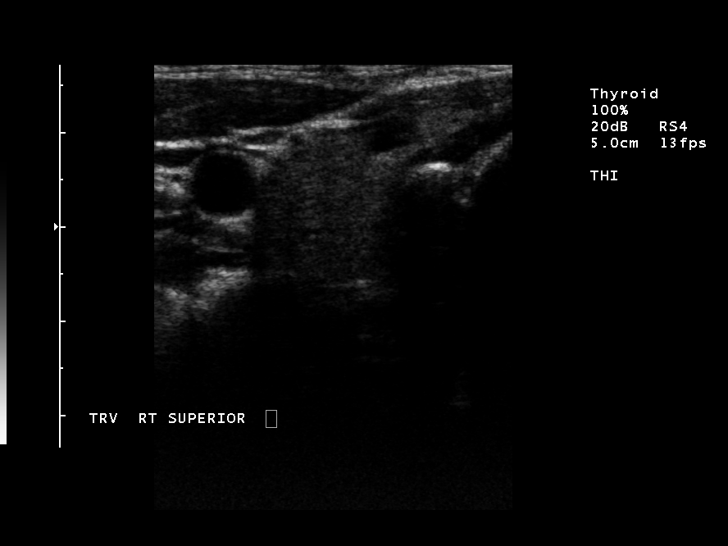
[im 20/58]
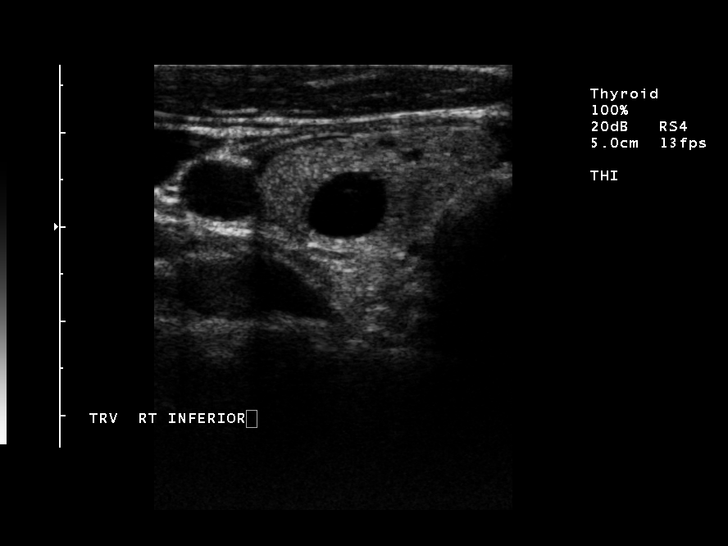
[im 24/58]
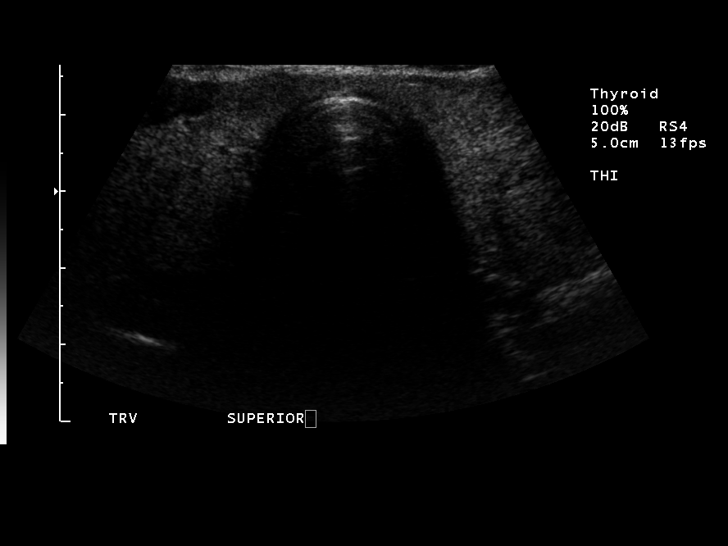
[im 29/58]
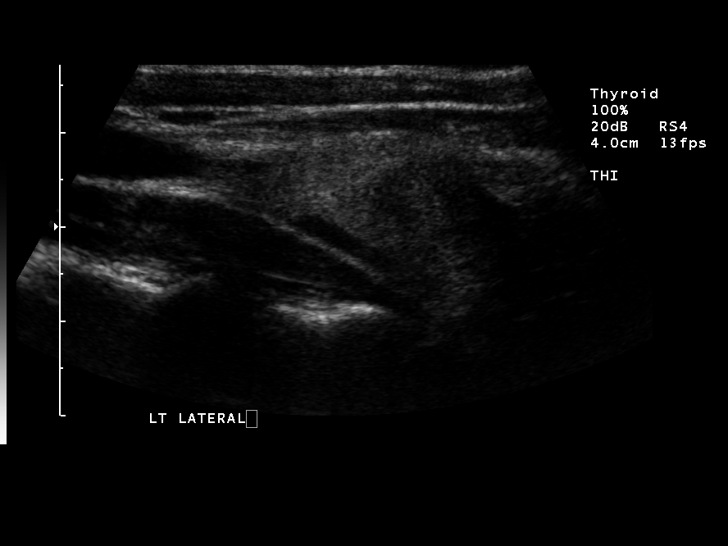
[im 34/58]
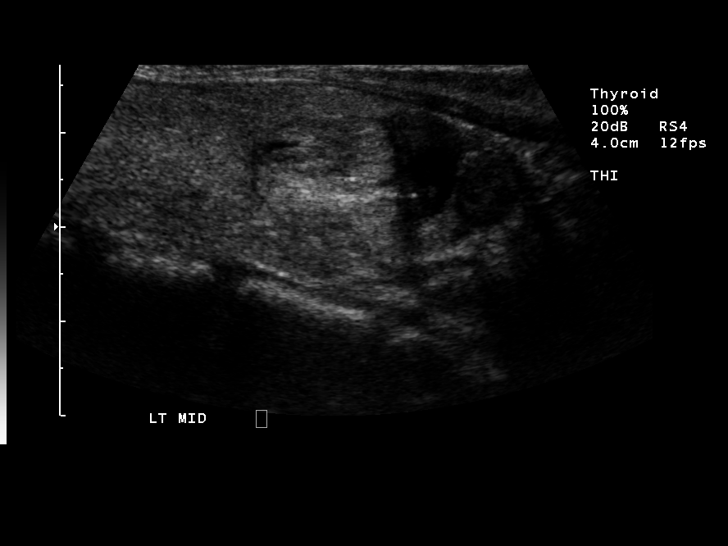
[im 39/58]
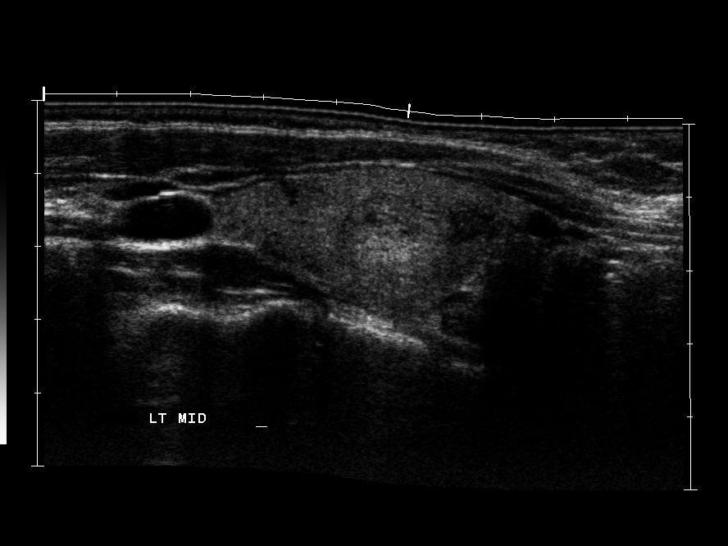
[im 43/58]
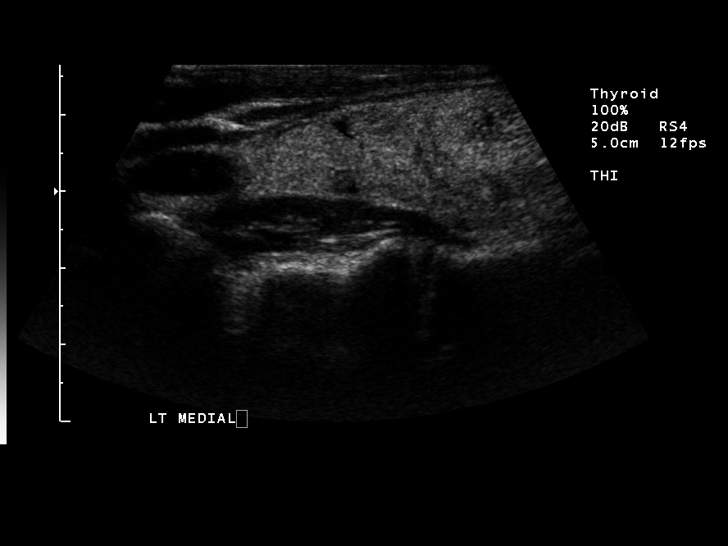
[im 48/58]
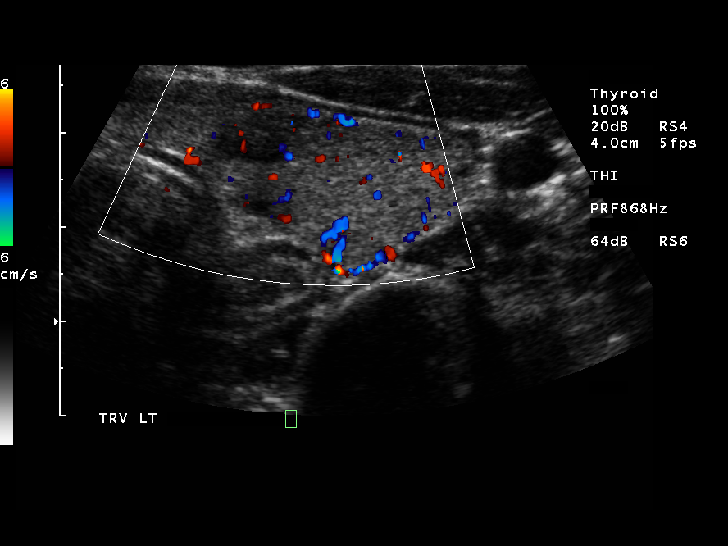
[im 53/58]
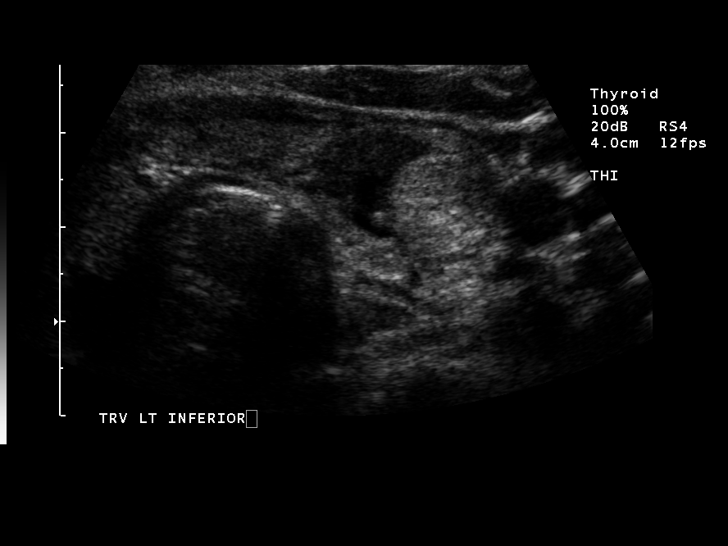
[im 58/58]
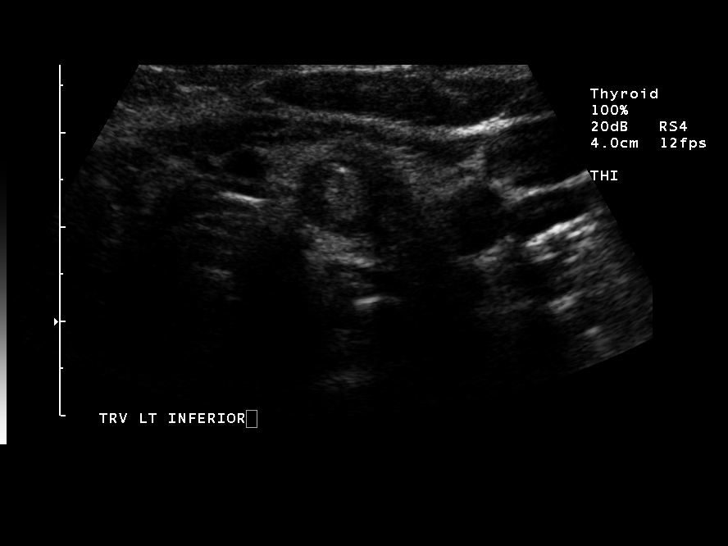

[13 of 25 positions shown; findings below may reference images not displayed]

FINDINGS: Thyroid gland measures slightly smaller today.  Right
lobe measures 6.9 cm in length and 2.1 x 2.8 cm in transverse
dimensions.  The left lobe measures 5.4 cm in length and 2.0 x
cm transversely.  The thyroid isthmus measures 3 mm in thickness.
Diffuse inhomogeneity of thyroid echotexture.  Multiple thyroid
nodules are again identified.  Again appreciated is the complex
nodule in the lower pole of the left thyroid lobe containing solid
and cystic components.  It measures 2.9 x 1.6 x 2.8 cm compared to
2.9 x 1.9 x 1.9 cm on the prior exam.  There is 8 mm hypoechoic
nodule in the medial and superior aspect the left thyroid lobe.
This nodule is essentially unchanged.  No change in cystic nodule
with calcifications in the mid to inferior aspect of the right
thyroid lobe measuring 1.2 x 0.8 x 0.8 cm.  On the prior exam it
measured 1.1 x 0.8 x 0.8 cm.  Small complex cystic lesion in the
lateral aspect of the right thyroid lobe measuring 0.8 x 0.4 x
cm.  The two small cystic-like nodules in the superior pole of the
right thyroid lobe noted on prior exam are not appreciated now.
IMPRESSION: Overall stable appearance of the thyroid nodules.  The dominant
complex nodule in the inferior pole left thyroid lobe appears
stable.

## 2008-10-06 ENCOUNTER — Encounter: Admission: RE | Admit: 2008-10-06 | Discharge: 2008-10-06 | Payer: Self-pay | Admitting: Neurological Surgery

## 2008-10-06 IMAGING — CT CT L SPINE W/ CM
2 of 19 series · 6 of 33 positions shown, 7 images · non-contrast
Comparison: none

CLINICAL DATA: Back pain.  History of ray cage fusion.  Persistent
back pain.
TECHNIQUE: Contiguous axial images were obtained through the lumbar
spine without infusion. Coronal, sagittal, and disc space
reconstructions were obtained of the axial image sets.

[Series 2: l spine · axial · 0.27mm/px · z∈[-173,-173]mm · 1 of 101 slices shown, 2 images]
[im 1/101  soft-tissue]
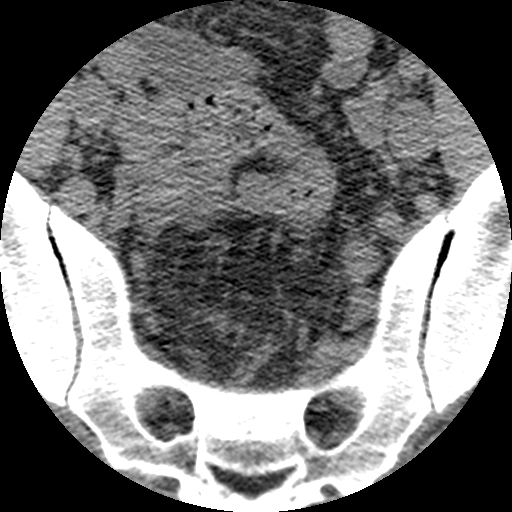
[im 1/101  bone]
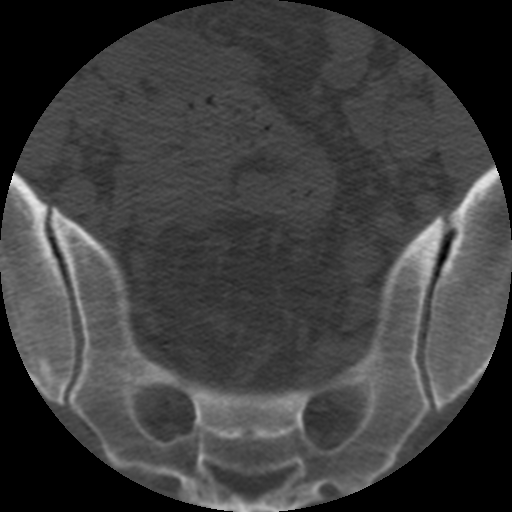

[Series 402: supine sagittal · sagittal · 0.50mm/px · 5 of 40 slices shown]
[im 7/40  bone]
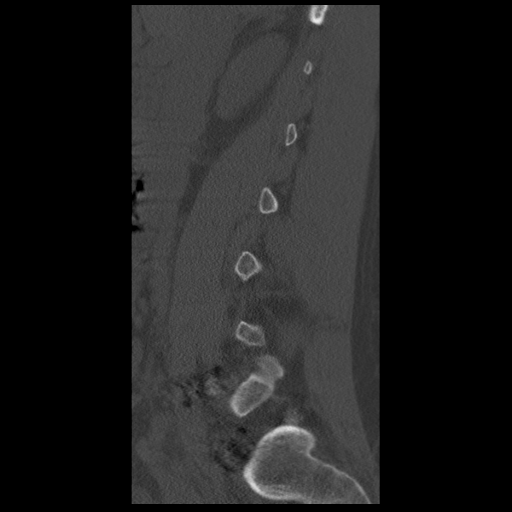
[im 14/40  bone]
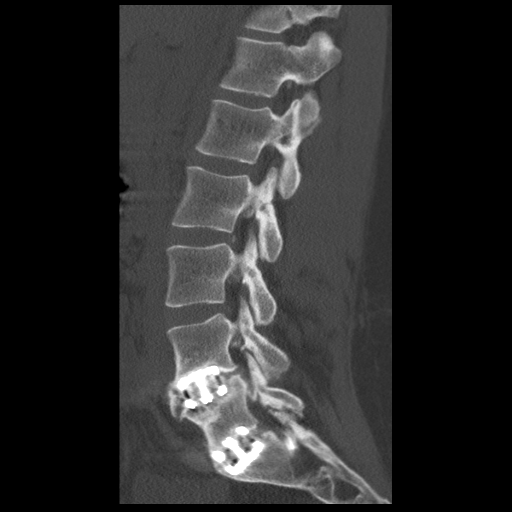
[im 20/40  bone]
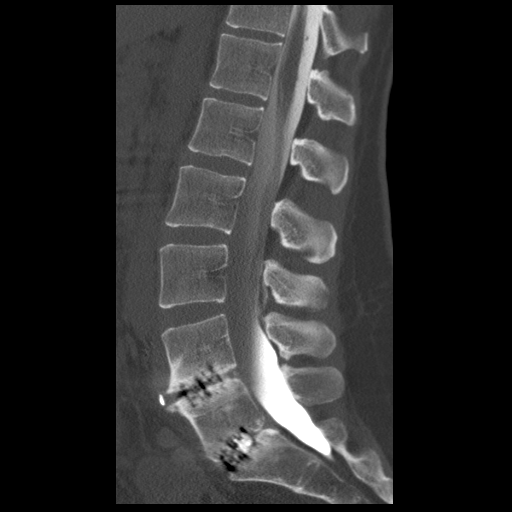
[im 27/40  bone]
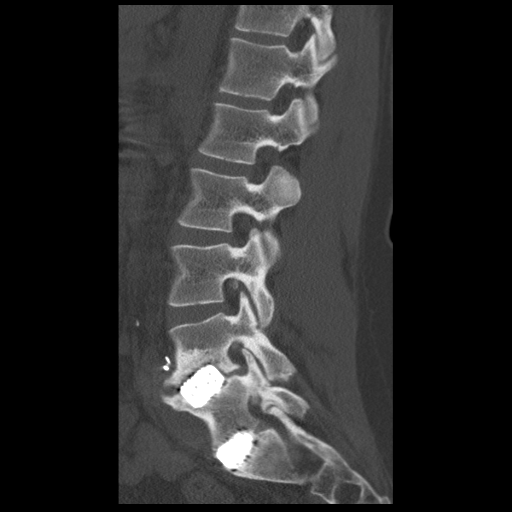
[im 33/40  bone]
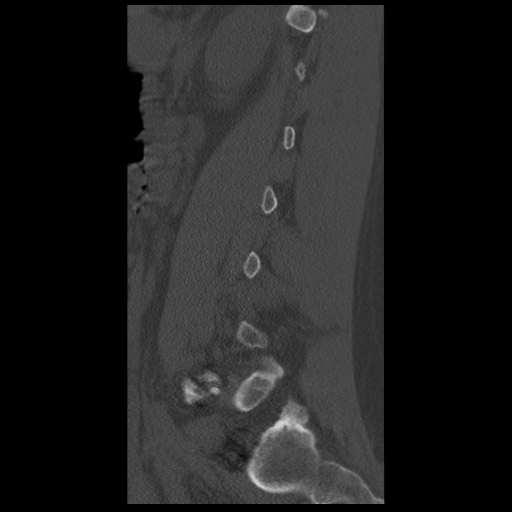

[6 of 33 positions shown; findings below may reference images not displayed]

LUMBAR MYELOGRAM

Procedure: After thorough discussion of risks and benefits of the
procedure including bleeding, infection, injury to nerves, blood
vessels, adjacent structures as well as headache and CSF leak,
written and oral informed consent was obtained.   Consent was
obtained by Dr.IDRISOVA.

Patient was positioned prone on the fluoroscopy table. Local
anesthesia was provided with 1% lidocaine without epinephrine after
prepped and draped in the usual sterile fashion. Puncture was
performed at L3-L4 using a three and one half inch 22-gauge spinal
needle via right paramedian approach.  Using a single pass through
the dura, the needle was placed within the thecal sac, with return
of clear CSF. 15 mL of [1I] was injected into the thecal
sac, with normal opacification of the nerve roots and cauda equina
consistent with free flow within the subarachnoid space.

Fluoroscopy time: 1 minute 24 seconds
FINDINGS: Five lumbar type vertebral bodies are identified.  The
numbering convention used for this exam terms L5-S1 as the last
full intervertebral disc space above the sacrum.  There is a T12
vertebra with vestigial ribs.  Ray cage fusion is present at L4-L5
and L5-S1.  Alignment in the neutral position is anatomic.  Small
anterior extradural impressions are present at L3-L4 likely
representing scar in the setting of discectomy.  Small anterior
extradural impression at L3-L4.  In the neutral position alignment
is anatomic.  Sclerosis around the L4-L5 ray cage.  With extension
the L4-L5 disc space does appear to open slightly with increased
lucency around the cage grafts.  No motion is identified at L5-S1.
No listhesis is identified with flexion maneuver.

IUD and reliably noted in the uterus.  Surgical clips present
anterior to the spine associated with anterior fusion.
IMPRESSION: 1.  Successful lumbar puncture for lumbar myelogram.
2.  Postsurgical changes of discectomy and cage graft fusion at L4-
L5 and L5-S1.
3.  Mobility with flexion and extension at L4-L5 with slight
lucency around the cage graft on the extension maneuver.
4.  No motion observed at L5-S1 suggesting fusion.

CT LUMBAR MYELOGRAM
FINDINGS: Alignment at the time of CT scanning is anatomic.  There
is layering of contrast and prone and supine scanning was
performed.  Spinal cord terminates posterior to the L1 vertebral
body.  The paraspinal soft tissues appear within normal limits.
Five lumbar type vertebral bodies are present.  T12 has bilateral
vestigial ribs.

T12-L1: Negative.

L1-L2: Negative.

L2-L3: Tiny disc bulge without stenosis.

L3-L4: Tiny disc bulge without stenosis.

L4-L5: Anterior vascular clips are present.  There is sclerosis of
the endplates around the hardware best seen on sagittal images.
There is no evidence of complete fusion across the disc space in
fact there is lucency surrounding the superior aspect of the left
cage graft and inferior aspect of the right cage graft consistent
with mobility.  Infection is felt unlikely.  There is subsidence of
the left cage graft into the superior L5 vertebral body.  Anterior
osteophytes are present, consistent with pseudoarthrosis formation.
Residual disc is present posteriorly with broad-based left
eccentric disc bulge.  No central or lateral recess stenosis.
Posterior inferior endplate osteophytes are present on the right
producing mild right foraminal stenosis.  Inferior right L4
laminotomy.  Moderate bilateral facet arthrosis.

L5-S1: Completed fusion is present at L5-S1 across the disc space.
Left L5 laminotomy.  No central or lateral recess stenosis.
Conjoined right to s one nerve root are present.  Severe bilateral
facet arthrosis is present without ankylosis of the facets.
Spurring is present off the anterior aspect of the left L5-S1 facet
producing mild foraminal narrowing, potentially irritating the
exiting the left L5 nerve root.  The right L5 nerve root appears to
exit normally.
IMPRESSION: 1.  L5-S1 discectomy and ray cage fusion with completed fusion
across the disc space.  Bilateral hypertrophic facet changes with
mild left foraminal narrowing due to facet spurring. Left L5
laminotomy.
2.  L4-L5 failed fusion.  Lucency surrounds the superior aspect of
the left cage graft and inferior aspect of the right cage graft.
Subsidence of left cage graft into the superior L5 vertebral body.
Based on the sclerosis and productive changes, infection felt
unlikely. Right L4 laminotomy.
3. Mild right L4-L5 foraminal stenosis.

## 2008-10-06 IMAGING — RF DG MYELOGRAM LUMBAR
12 of 13 series · 12 of 13 positions shown · non-contrast
Comparison: none

CLINICAL DATA: Back pain.  History of ray cage fusion.  Persistent
back pain.
TECHNIQUE: Contiguous axial images were obtained through the lumbar
spine without infusion. Coronal, sagittal, and disc space
reconstructions were obtained of the axial image sets.

[Series 1: (hospital) · 1 of 1 slices shown]
[im 1/1]
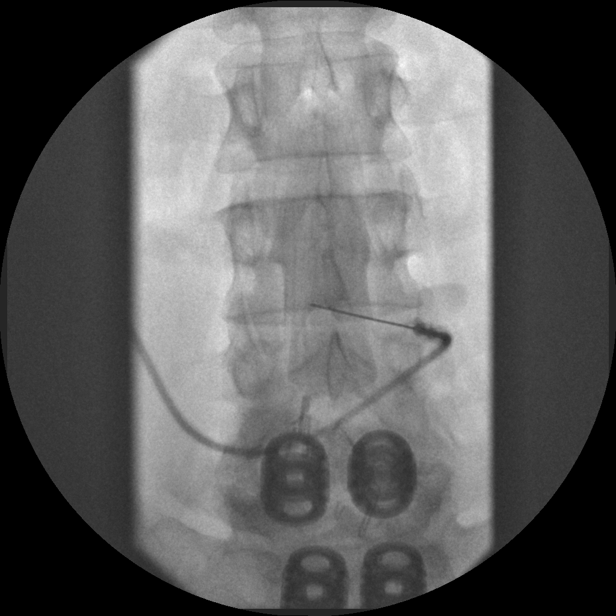

[Series 2: myelogram  white · 1 of 1 slices shown (1 of 11)]
[im 1/1]
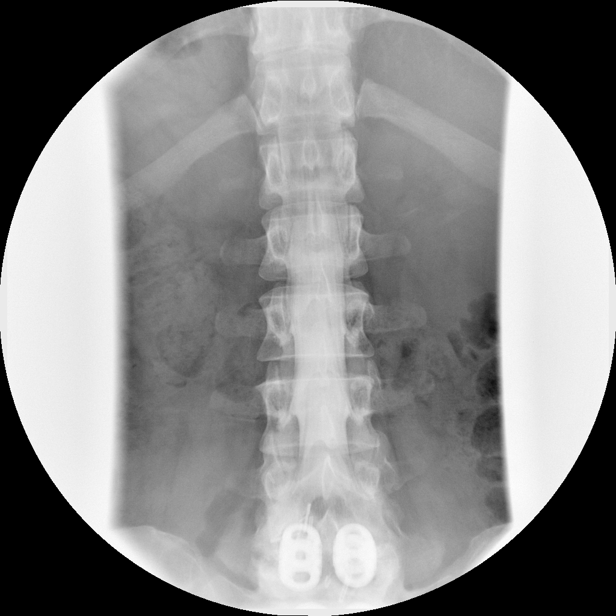

[Series 3: myelogram  white · 1 of 1 slices shown (2 of 11)]
[im 1/1]
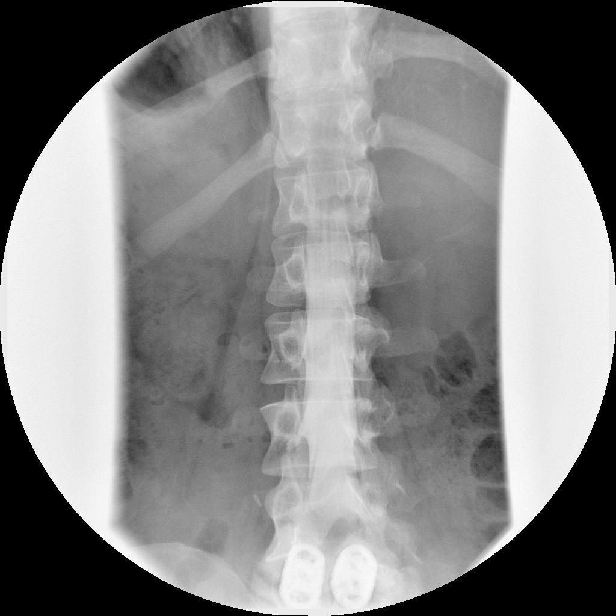

[Series 4: myelogram  white · 1 of 1 slices shown (3 of 11)]
[im 1/1]
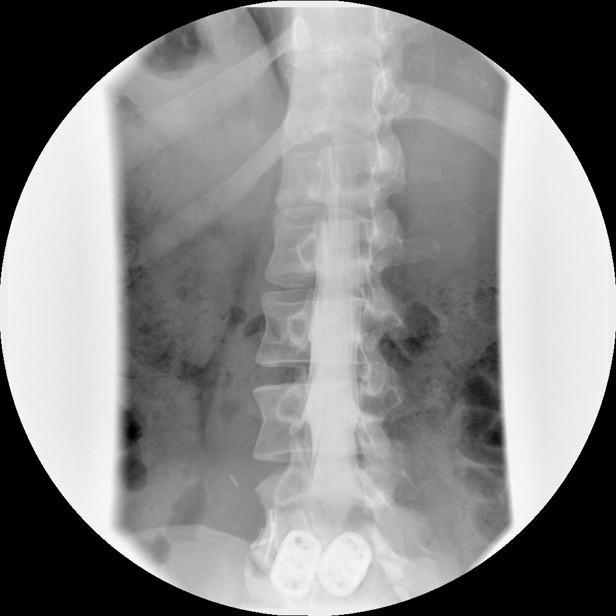

[Series 5: myelogram  white · 1 of 1 slices shown (4 of 11)]
[im 1/1]
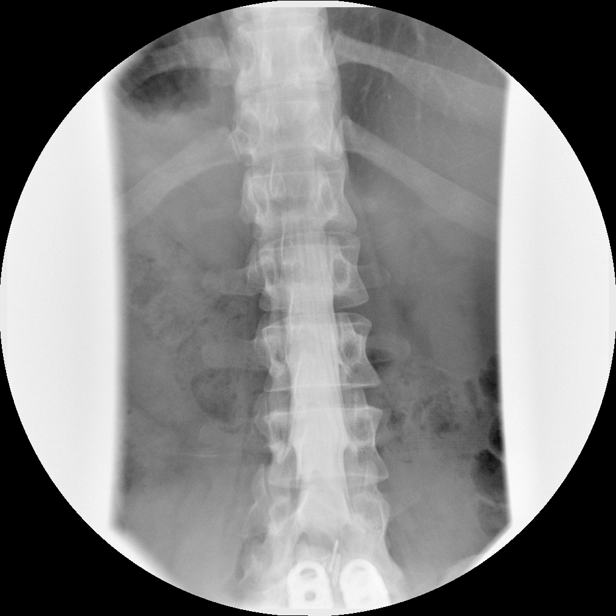

[Series 6: myelogram  white · 1 of 1 slices shown (5 of 11)]
[im 1/1]
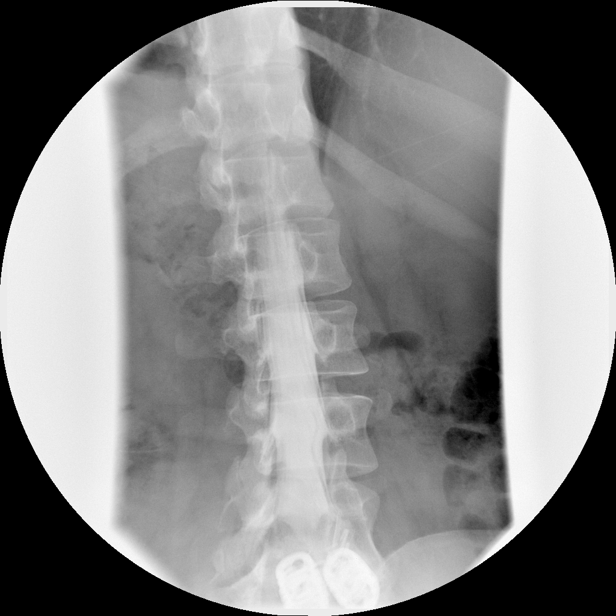

[Series 8: myelogram  white · 1 of 1 slices shown (6 of 11)]
[im 1/1]
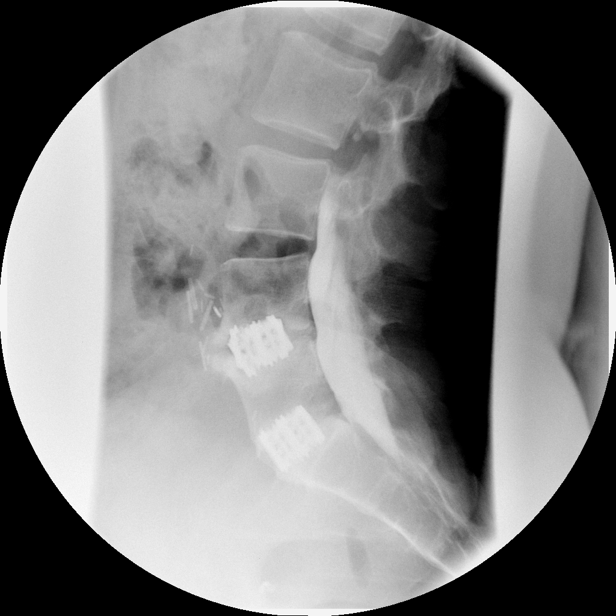

[Series 9: myelogram  white · 1 of 1 slices shown (7 of 11)]
[im 1/1]
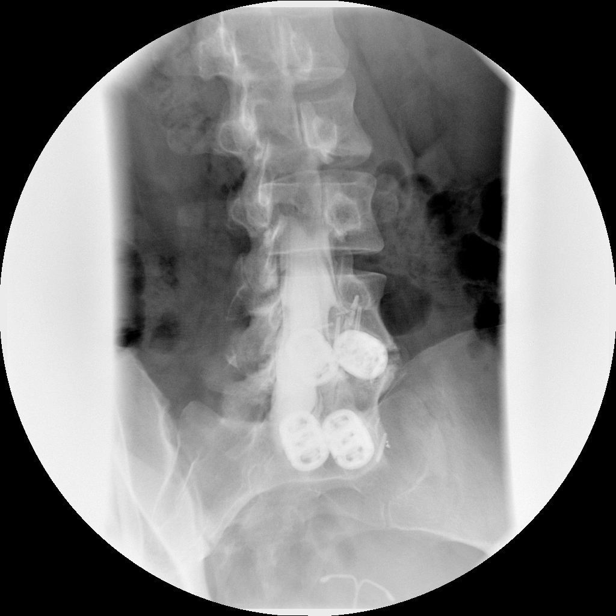

[Series 10: myelogram  white · 1 of 1 slices shown (8 of 11)]
[im 1/1]
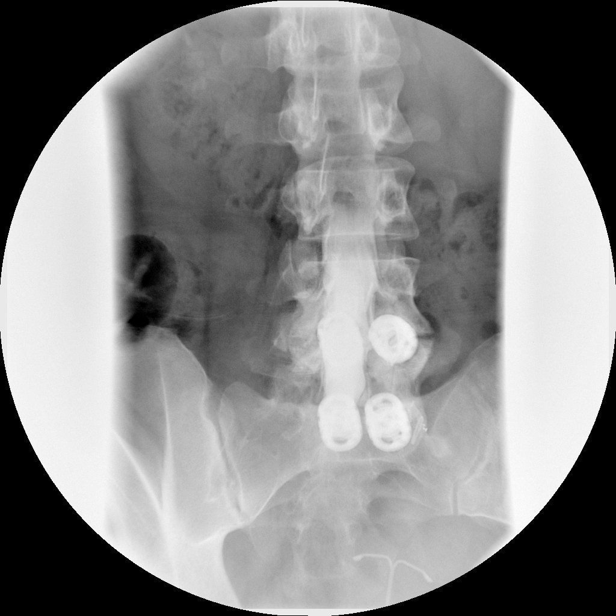

[Series 11: myelogram  white · 1 of 1 slices shown (9 of 11)]
[im 1/1]
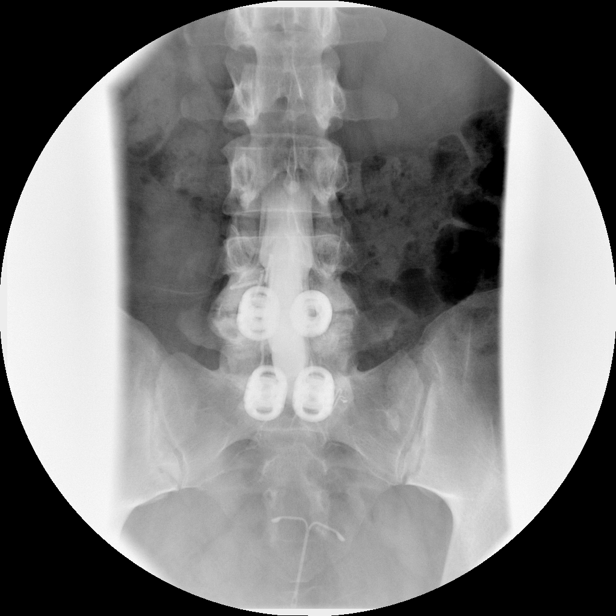

[Series 12: myelogram  white · 1 of 1 slices shown (10 of 11)]
[im 1/1]
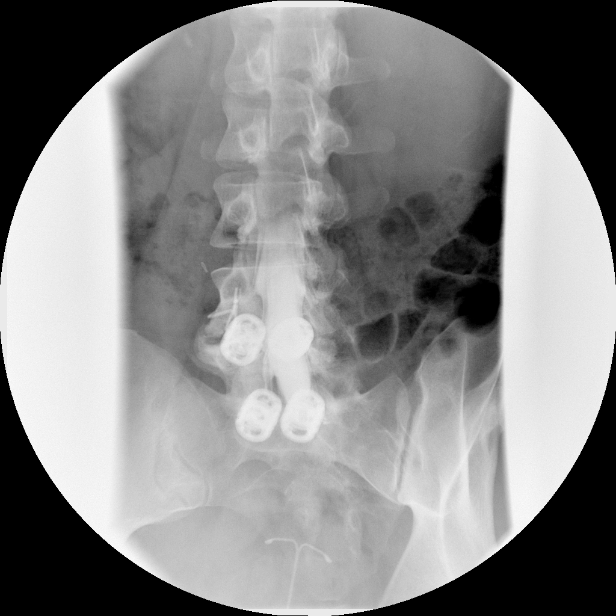

[Series 13: myelogram  white · 1 of 1 slices shown (11 of 11)]
[im 1/1]
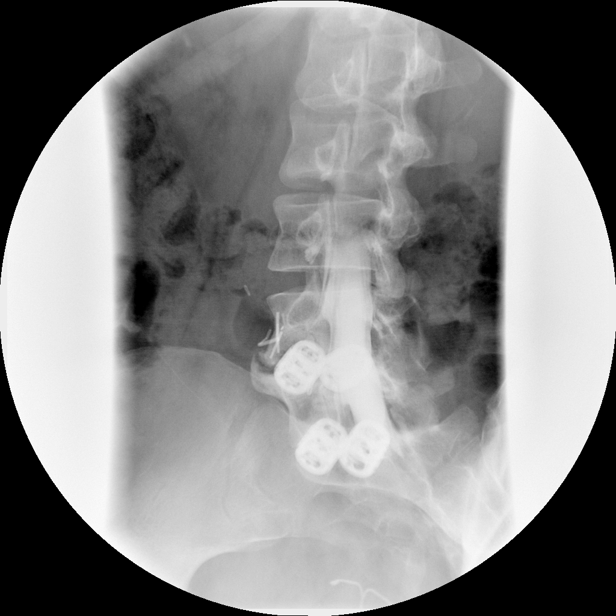

[12 of 13 positions shown; findings below may reference images not displayed]

LUMBAR MYELOGRAM

Procedure: After thorough discussion of risks and benefits of the
procedure including bleeding, infection, injury to nerves, blood
vessels, adjacent structures as well as headache and CSF leak,
written and oral informed consent was obtained.   Consent was
obtained by Dr.IDRISOVA.

Patient was positioned prone on the fluoroscopy table. Local
anesthesia was provided with 1% lidocaine without epinephrine after
prepped and draped in the usual sterile fashion. Puncture was
performed at L3-L4 using a three and one half inch 22-gauge spinal
needle via right paramedian approach.  Using a single pass through
the dura, the needle was placed within the thecal sac, with return
of clear CSF. 15 mL of [1I] was injected into the thecal
sac, with normal opacification of the nerve roots and cauda equina
consistent with free flow within the subarachnoid space.

Fluoroscopy time: 1 minute 24 seconds
FINDINGS: Five lumbar type vertebral bodies are identified.  The
numbering convention used for this exam terms L5-S1 as the last
full intervertebral disc space above the sacrum.  There is a T12
vertebra with vestigial ribs.  Ray cage fusion is present at L4-L5
and L5-S1.  Alignment in the neutral position is anatomic.  Small
anterior extradural impressions are present at L3-L4 likely
representing scar in the setting of discectomy.  Small anterior
extradural impression at L3-L4.  In the neutral position alignment
is anatomic.  Sclerosis around the L4-L5 ray cage.  With extension
the L4-L5 disc space does appear to open slightly with increased
lucency around the cage grafts.  No motion is identified at L5-S1.
No listhesis is identified with flexion maneuver.

IUD and reliably noted in the uterus.  Surgical clips present
anterior to the spine associated with anterior fusion.
IMPRESSION: 1.  Successful lumbar puncture for lumbar myelogram.
2.  Postsurgical changes of discectomy and cage graft fusion at L4-
L5 and L5-S1.
3.  Mobility with flexion and extension at L4-L5 with slight
lucency around the cage graft on the extension maneuver.
4.  No motion observed at L5-S1 suggesting fusion.

CT LUMBAR MYELOGRAM
FINDINGS: Alignment at the time of CT scanning is anatomic.  There
is layering of contrast and prone and supine scanning was
performed.  Spinal cord terminates posterior to the L1 vertebral
body.  The paraspinal soft tissues appear within normal limits.
Five lumbar type vertebral bodies are present.  T12 has bilateral
vestigial ribs.

T12-L1: Negative.

L1-L2: Negative.

L2-L3: Tiny disc bulge without stenosis.

L3-L4: Tiny disc bulge without stenosis.

L4-L5: Anterior vascular clips are present.  There is sclerosis of
the endplates around the hardware best seen on sagittal images.
There is no evidence of complete fusion across the disc space in
fact there is lucency surrounding the superior aspect of the left
cage graft and inferior aspect of the right cage graft consistent
with mobility.  Infection is felt unlikely.  There is subsidence of
the left cage graft into the superior L5 vertebral body.  Anterior
osteophytes are present, consistent with pseudoarthrosis formation.
Residual disc is present posteriorly with broad-based left
eccentric disc bulge.  No central or lateral recess stenosis.
Posterior inferior endplate osteophytes are present on the right
producing mild right foraminal stenosis.  Inferior right L4
laminotomy.  Moderate bilateral facet arthrosis.

L5-S1: Completed fusion is present at L5-S1 across the disc space.
Left L5 laminotomy.  No central or lateral recess stenosis.
Conjoined right to s one nerve root are present.  Severe bilateral
facet arthrosis is present without ankylosis of the facets.
Spurring is present off the anterior aspect of the left L5-S1 facet
producing mild foraminal narrowing, potentially irritating the
exiting the left L5 nerve root.  The right L5 nerve root appears to
exit normally.
IMPRESSION: 1.  L5-S1 discectomy and ray cage fusion with completed fusion
across the disc space.  Bilateral hypertrophic facet changes with
mild left foraminal narrowing due to facet spurring. Left L5
laminotomy.
2.  L4-L5 failed fusion.  Lucency surrounds the superior aspect of
the left cage graft and inferior aspect of the right cage graft.
Subsidence of left cage graft into the superior L5 vertebral body.
Based on the sclerosis and productive changes, infection felt
unlikely. Right L4 laminotomy.
3. Mild right L4-L5 foraminal stenosis.

## 2008-10-06 IMAGING — CR DG MYELOGRAM LUMBAR
3 series · 3 of 3 positions shown · non-contrast
Comparison: none

CLINICAL DATA: Back pain.  History of ray cage fusion.  Persistent
back pain.
TECHNIQUE: Contiguous axial images were obtained through the lumbar
spine without infusion. Coronal, sagittal, and disc space
reconstructions were obtained of the axial image sets.

[view not recorded (1 of 3)]
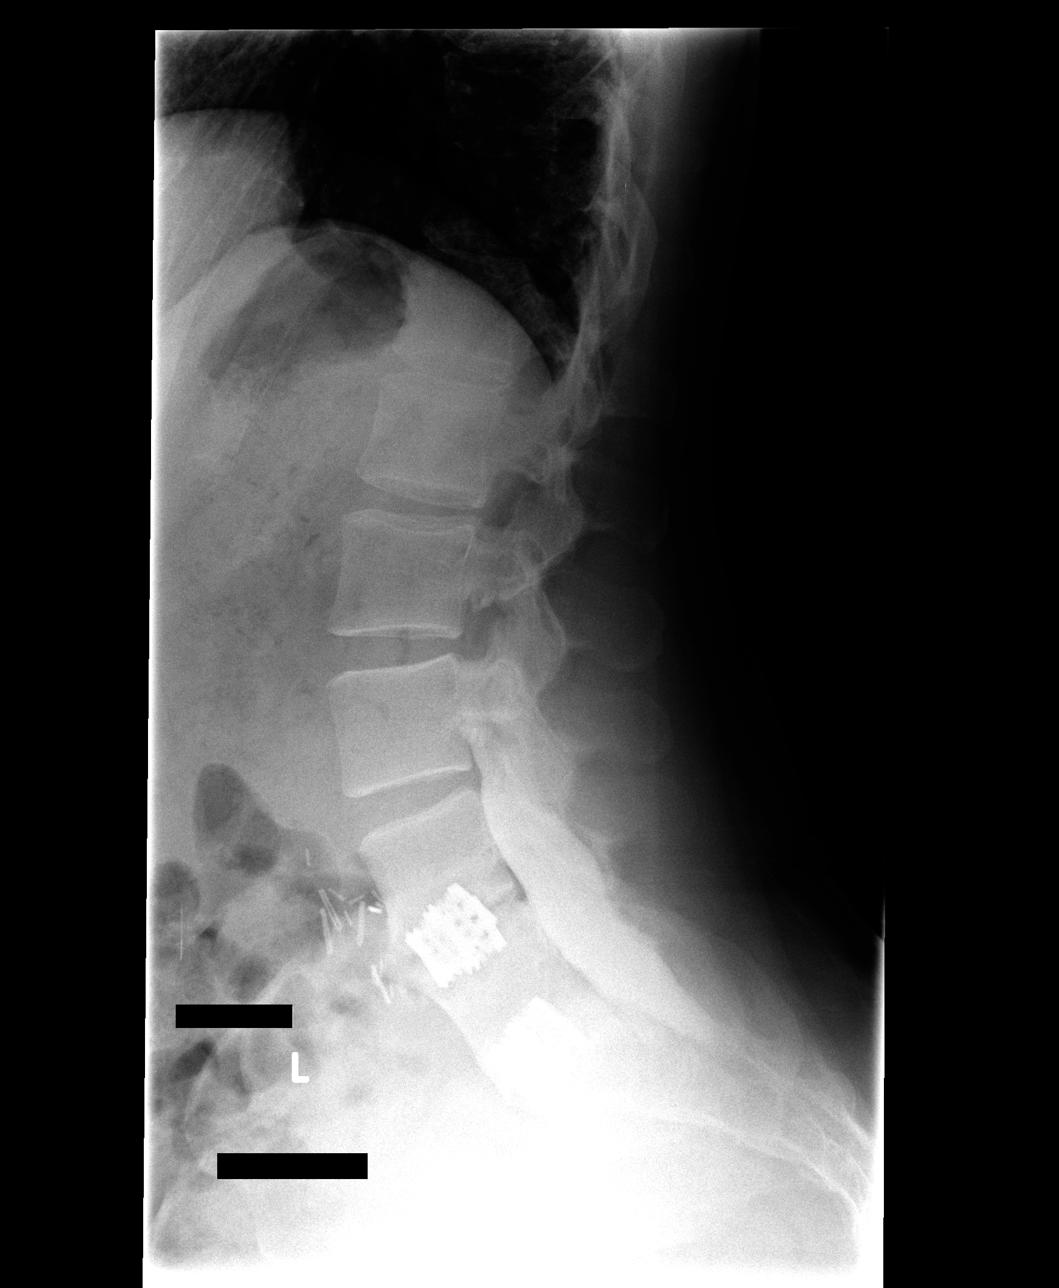

[view not recorded (2 of 3)]
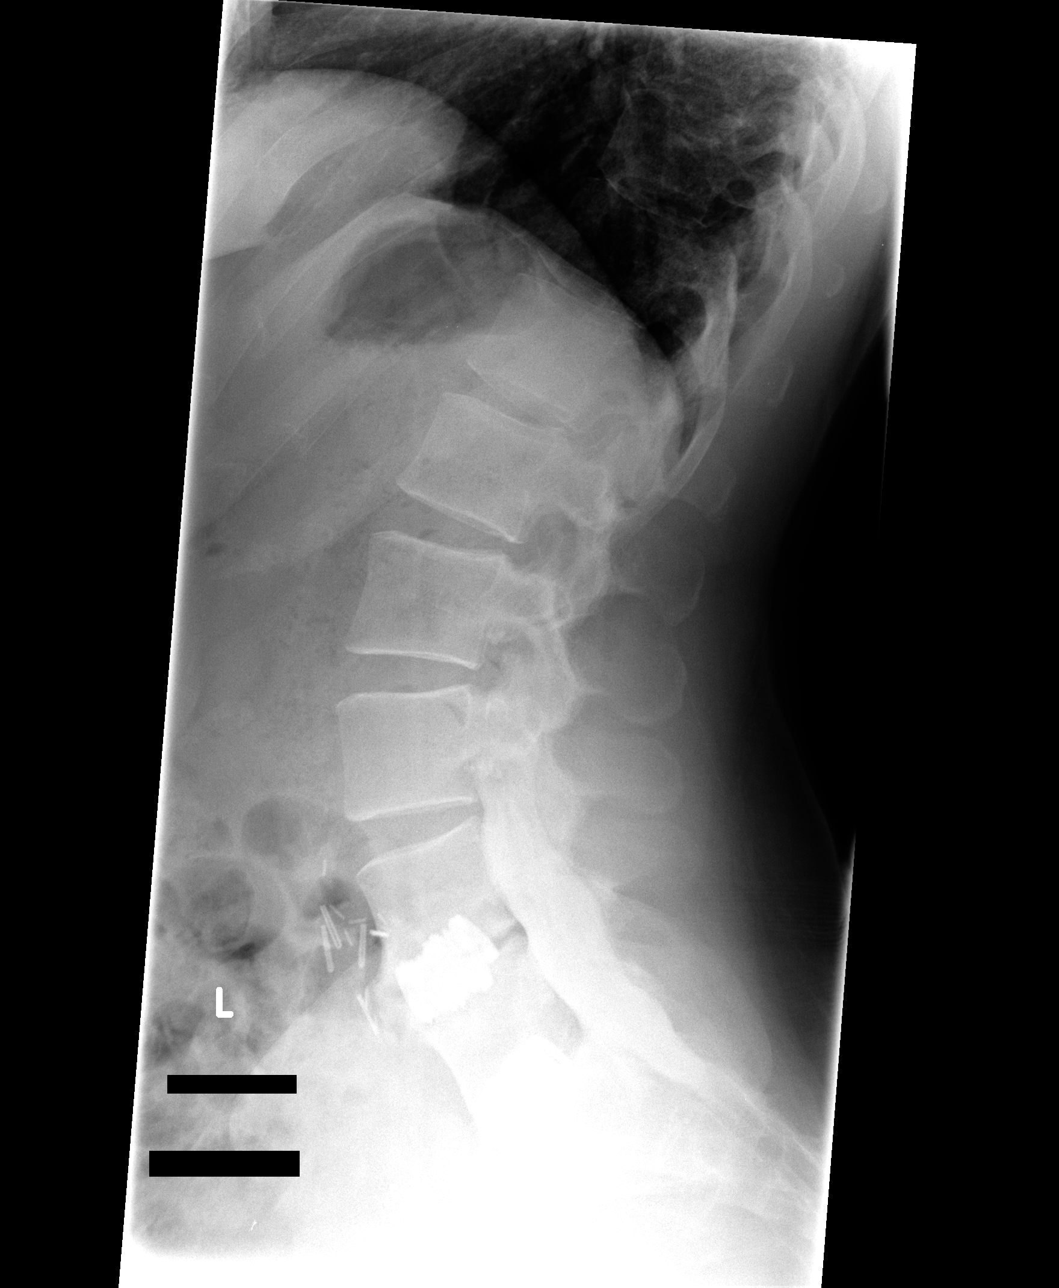

[view not recorded (3 of 3)]
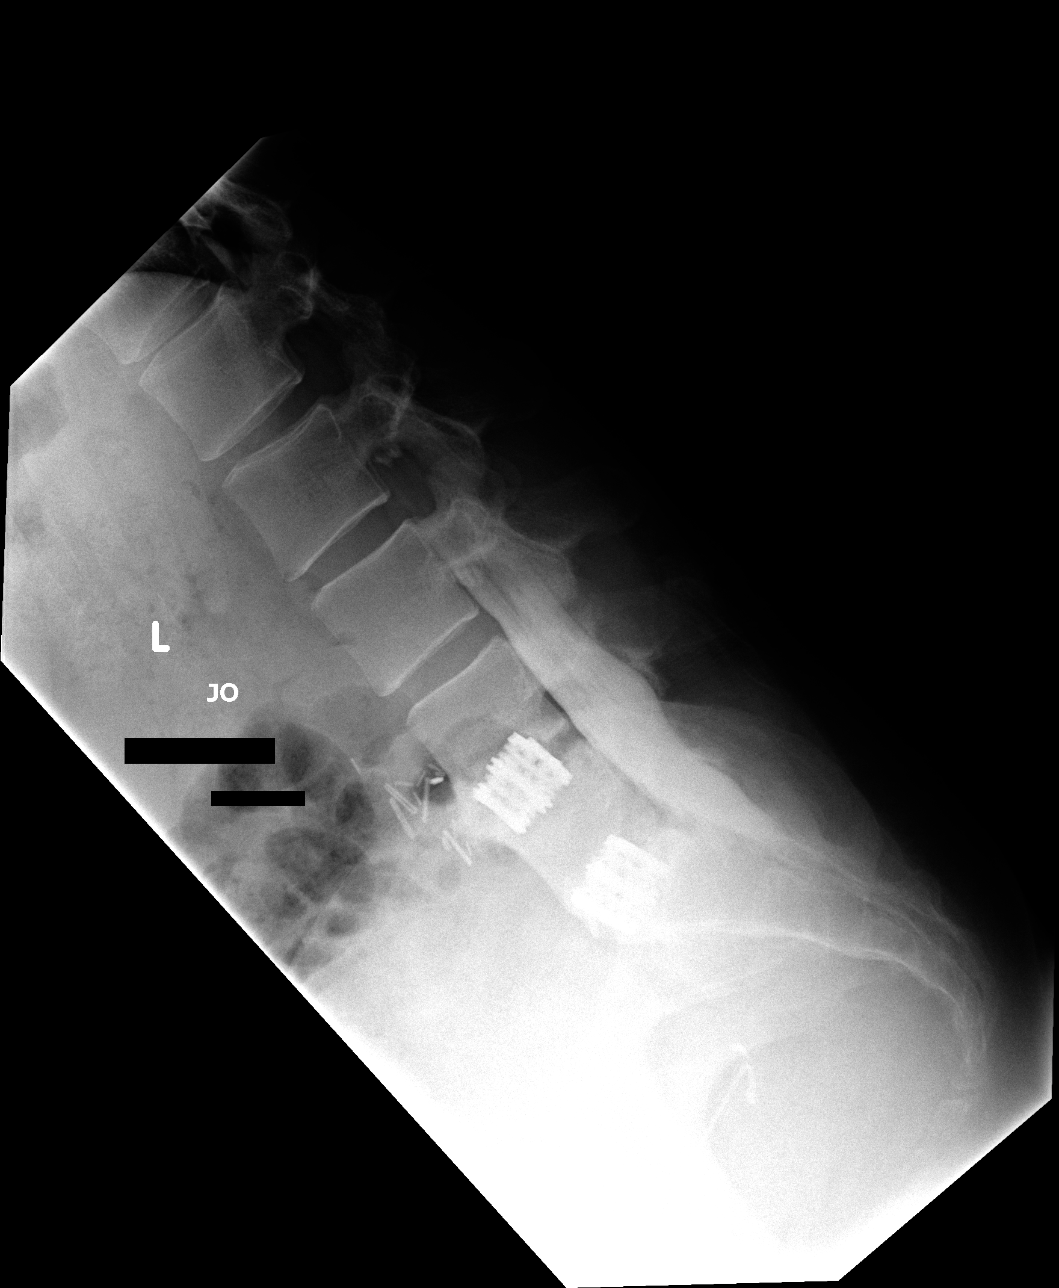

[3 of 3 positions shown; findings below may reference images not displayed]

LUMBAR MYELOGRAM

Procedure: After thorough discussion of risks and benefits of the
procedure including bleeding, infection, injury to nerves, blood
vessels, adjacent structures as well as headache and CSF leak,
written and oral informed consent was obtained.   Consent was
obtained by Dr.IDRISOVA.

Patient was positioned prone on the fluoroscopy table. Local
anesthesia was provided with 1% lidocaine without epinephrine after
prepped and draped in the usual sterile fashion. Puncture was
performed at L3-L4 using a three and one half inch 22-gauge spinal
needle via right paramedian approach.  Using a single pass through
the dura, the needle was placed within the thecal sac, with return
of clear CSF. 15 mL of [1I] was injected into the thecal
sac, with normal opacification of the nerve roots and cauda equina
consistent with free flow within the subarachnoid space.

Fluoroscopy time: 1 minute 24 seconds
FINDINGS: Five lumbar type vertebral bodies are identified.  The
numbering convention used for this exam terms L5-S1 as the last
full intervertebral disc space above the sacrum.  There is a T12
vertebra with vestigial ribs.  Ray cage fusion is present at L4-L5
and L5-S1.  Alignment in the neutral position is anatomic.  Small
anterior extradural impressions are present at L3-L4 likely
representing scar in the setting of discectomy.  Small anterior
extradural impression at L3-L4.  In the neutral position alignment
is anatomic.  Sclerosis around the L4-L5 ray cage.  With extension
the L4-L5 disc space does appear to open slightly with increased
lucency around the cage grafts.  No motion is identified at L5-S1.
No listhesis is identified with flexion maneuver.

IUD and reliably noted in the uterus.  Surgical clips present
anterior to the spine associated with anterior fusion.
IMPRESSION: 1.  Successful lumbar puncture for lumbar myelogram.
2.  Postsurgical changes of discectomy and cage graft fusion at L4-
L5 and L5-S1.
3.  Mobility with flexion and extension at L4-L5 with slight
lucency around the cage graft on the extension maneuver.
4.  No motion observed at L5-S1 suggesting fusion.

CT LUMBAR MYELOGRAM
FINDINGS: Alignment at the time of CT scanning is anatomic.  There
is layering of contrast and prone and supine scanning was
performed.  Spinal cord terminates posterior to the L1 vertebral
body.  The paraspinal soft tissues appear within normal limits.
Five lumbar type vertebral bodies are present.  T12 has bilateral
vestigial ribs.

T12-L1: Negative.

L1-L2: Negative.

L2-L3: Tiny disc bulge without stenosis.

L3-L4: Tiny disc bulge without stenosis.

L4-L5: Anterior vascular clips are present.  There is sclerosis of
the endplates around the hardware best seen on sagittal images.
There is no evidence of complete fusion across the disc space in
fact there is lucency surrounding the superior aspect of the left
cage graft and inferior aspect of the right cage graft consistent
with mobility.  Infection is felt unlikely.  There is subsidence of
the left cage graft into the superior L5 vertebral body.  Anterior
osteophytes are present, consistent with pseudoarthrosis formation.
Residual disc is present posteriorly with broad-based left
eccentric disc bulge.  No central or lateral recess stenosis.
Posterior inferior endplate osteophytes are present on the right
producing mild right foraminal stenosis.  Inferior right L4
laminotomy.  Moderate bilateral facet arthrosis.

L5-S1: Completed fusion is present at L5-S1 across the disc space.
Left L5 laminotomy.  No central or lateral recess stenosis.
Conjoined right to s one nerve root are present.  Severe bilateral
facet arthrosis is present without ankylosis of the facets.
Spurring is present off the anterior aspect of the left L5-S1 facet
producing mild foraminal narrowing, potentially irritating the
exiting the left L5 nerve root.  The right L5 nerve root appears to
exit normally.
IMPRESSION: 1.  L5-S1 discectomy and ray cage fusion with completed fusion
across the disc space.  Bilateral hypertrophic facet changes with
mild left foraminal narrowing due to facet spurring. Left L5
laminotomy.
2.  L4-L5 failed fusion.  Lucency surrounds the superior aspect of
the left cage graft and inferior aspect of the right cage graft.
Subsidence of left cage graft into the superior L5 vertebral body.
Based on the sclerosis and productive changes, infection felt
unlikely. Right L4 laminotomy.
3. Mild right L4-L5 foraminal stenosis.

## 2008-11-24 ENCOUNTER — Inpatient Hospital Stay (HOSPITAL_COMMUNITY): Admission: RE | Admit: 2008-11-24 | Discharge: 2008-11-27 | Payer: Self-pay | Admitting: Neurological Surgery

## 2008-11-24 IMAGING — RF DG LUMBAR SPINE 2-3V
1 series · 2 of 2 positions shown · non-contrast
Comparison: CT [DATE]

CLINICAL DATA: L4-5 pseudoarthrosis.

LUMBAR SPINE - 2-3 VIEW

[Series 1: run · 2 of 2 slices shown]
[im 1/2]
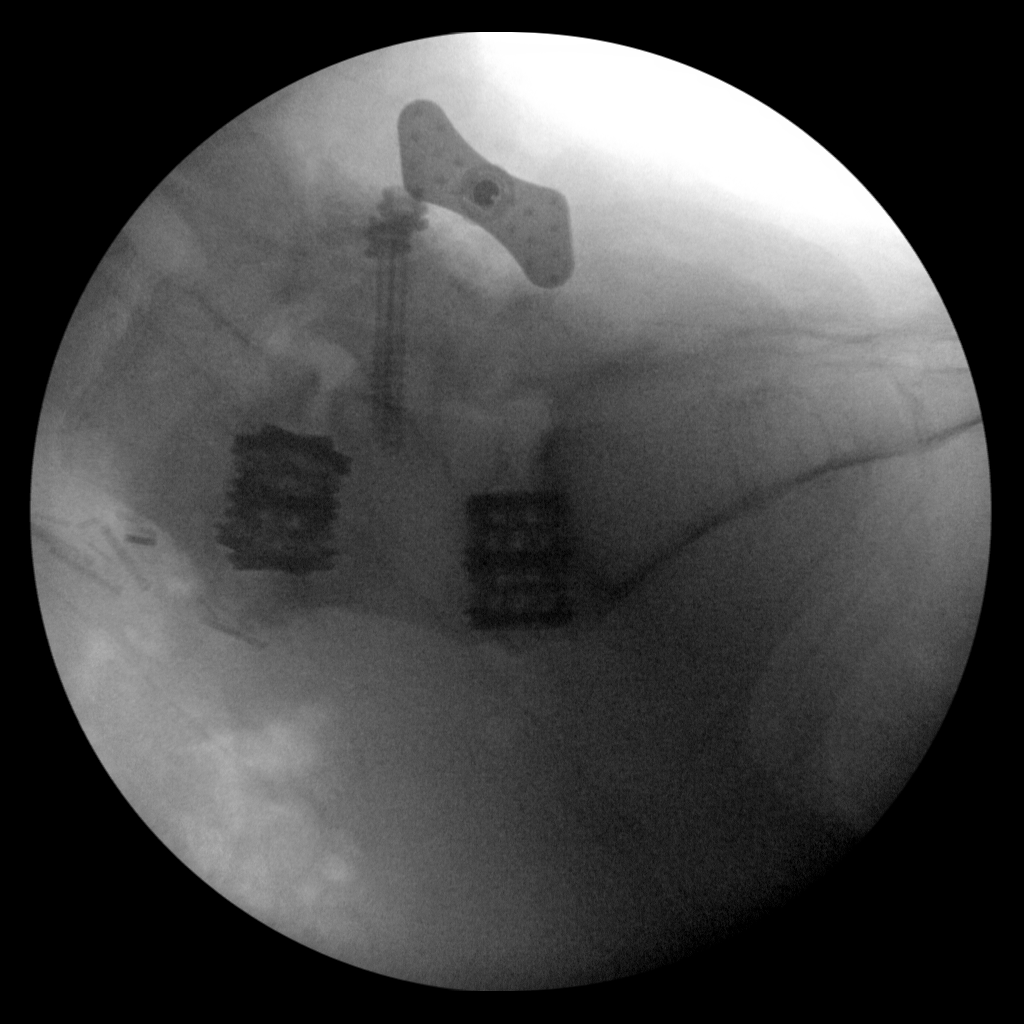
[im 2/2]
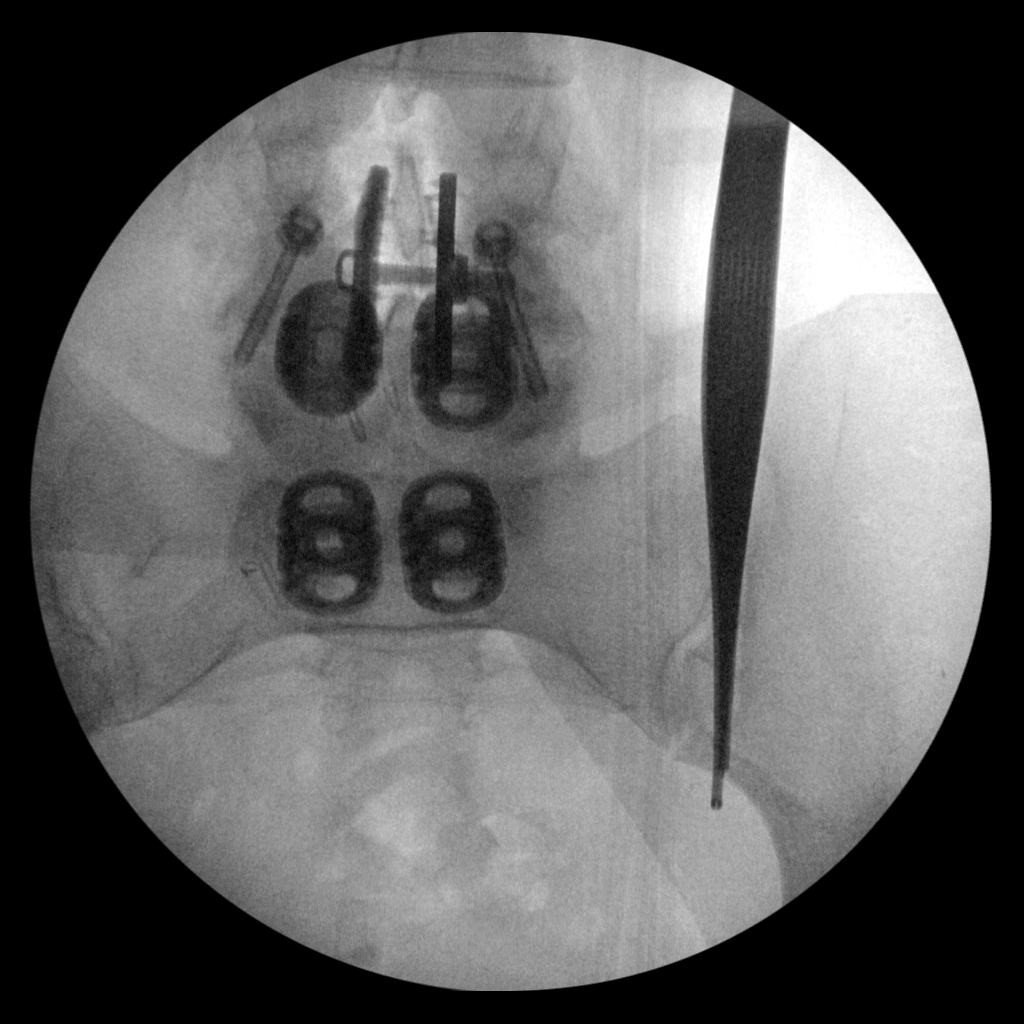

[2 of 2 positions shown; findings below may reference images not displayed]

FINDINGS: 2 intraoperative spot images show placement of trans
facet screws at L4-5.  X-stop device noted posteriorly.  Disc
spacers noted at L4-5 and L5-S1 as seen on prior CT.
IMPRESSION: Intraoperative images as above.

REF:G5 DICTATED: [DATE] [DATE]

## 2008-12-20 ENCOUNTER — Encounter: Admission: RE | Admit: 2008-12-20 | Discharge: 2008-12-20 | Payer: Self-pay | Admitting: Neurological Surgery

## 2008-12-20 IMAGING — CR DG LUMBAR SPINE 2-3V
3 series · 3 of 3 positions shown · non-contrast
Comparison: [HOSPITAL] at [HOSPITAL] post myelogram
lumbar spine CT [DATE] and [HOSPITAL] lumbar spine
radiographs [DATE].

CLINICAL DATA: L4-5 fusion and [DATE] with low back left leg
pain.

LUMBAR SPINE - 2-3 VIEW

[t l-spine a.p.]
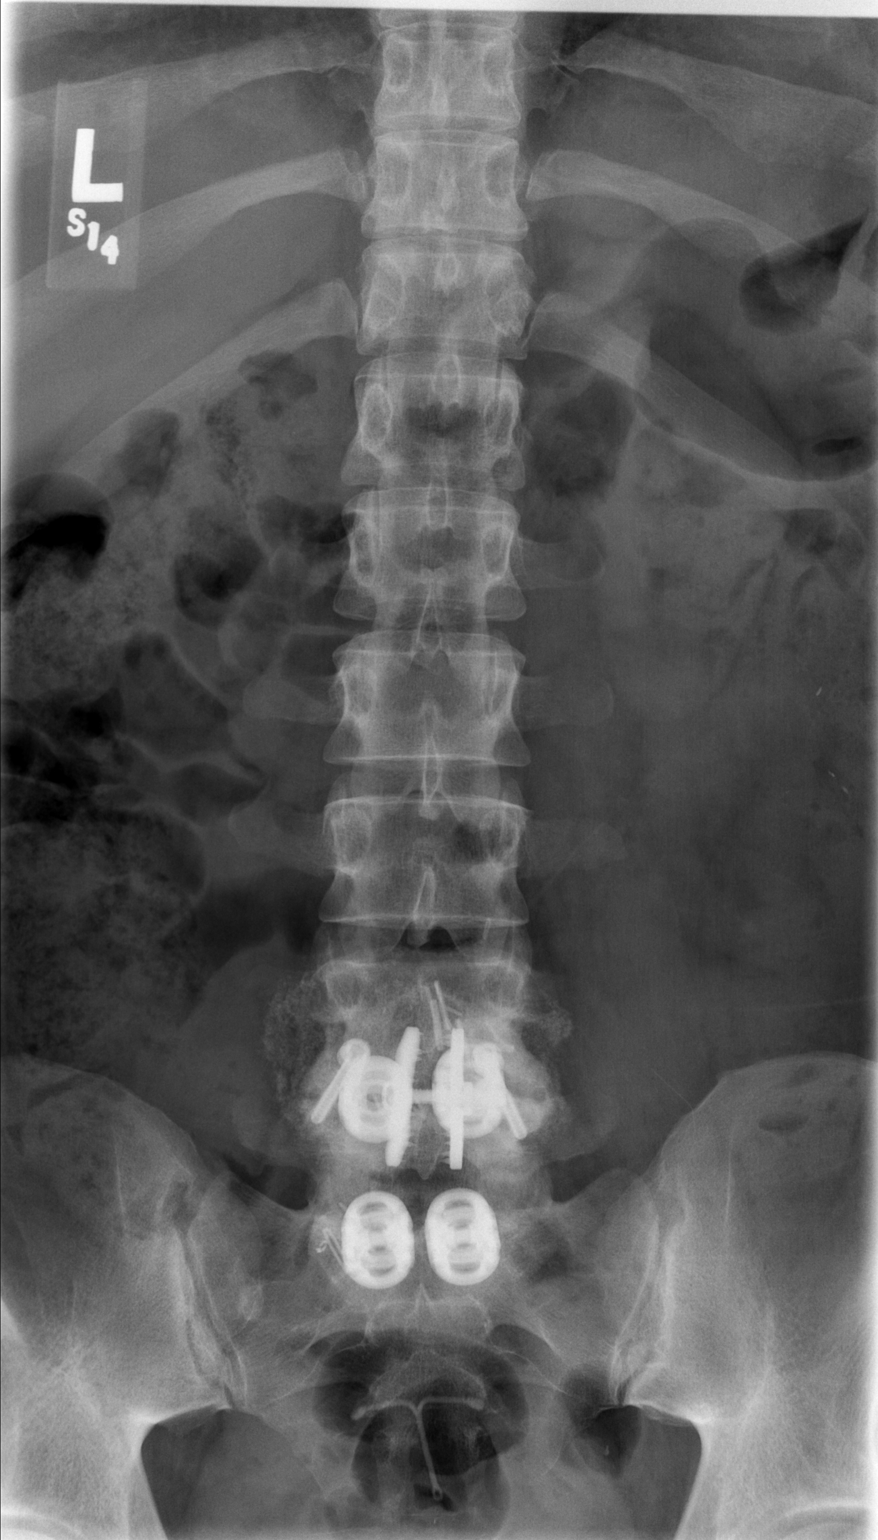

[t l-spine lat]
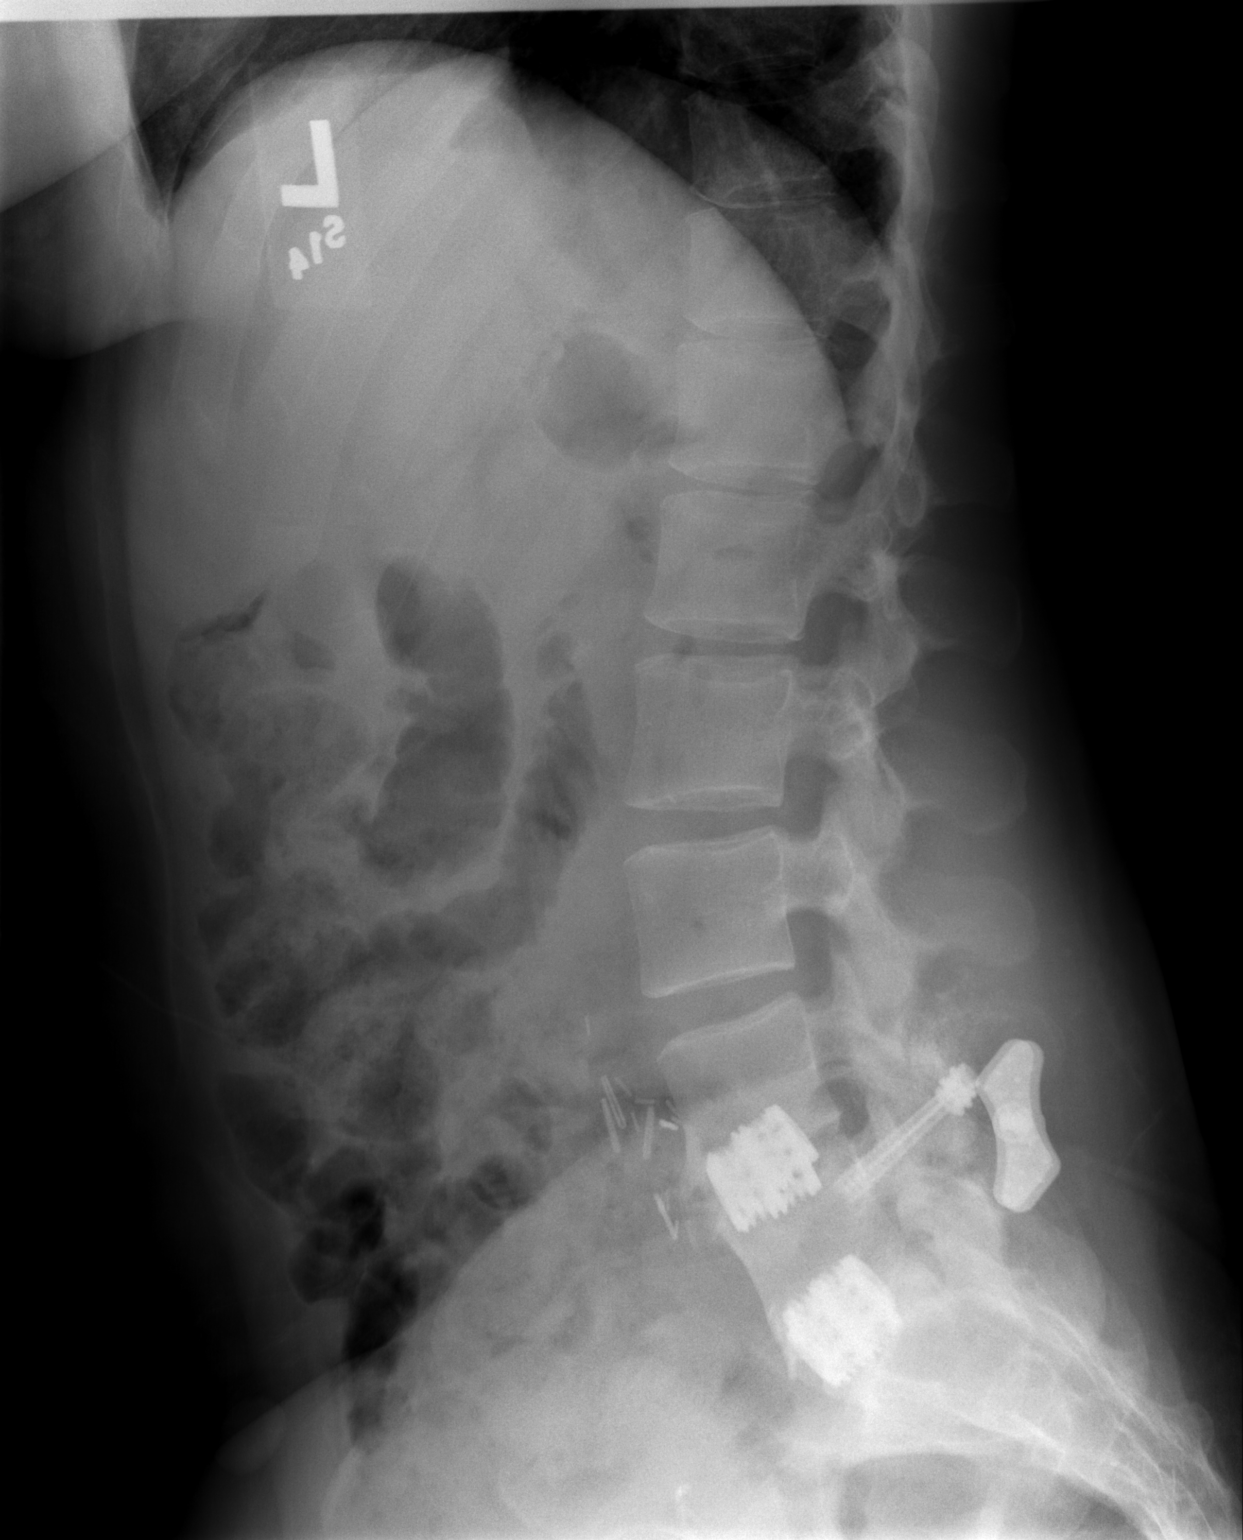

[t l-spine l5-s1 spot]
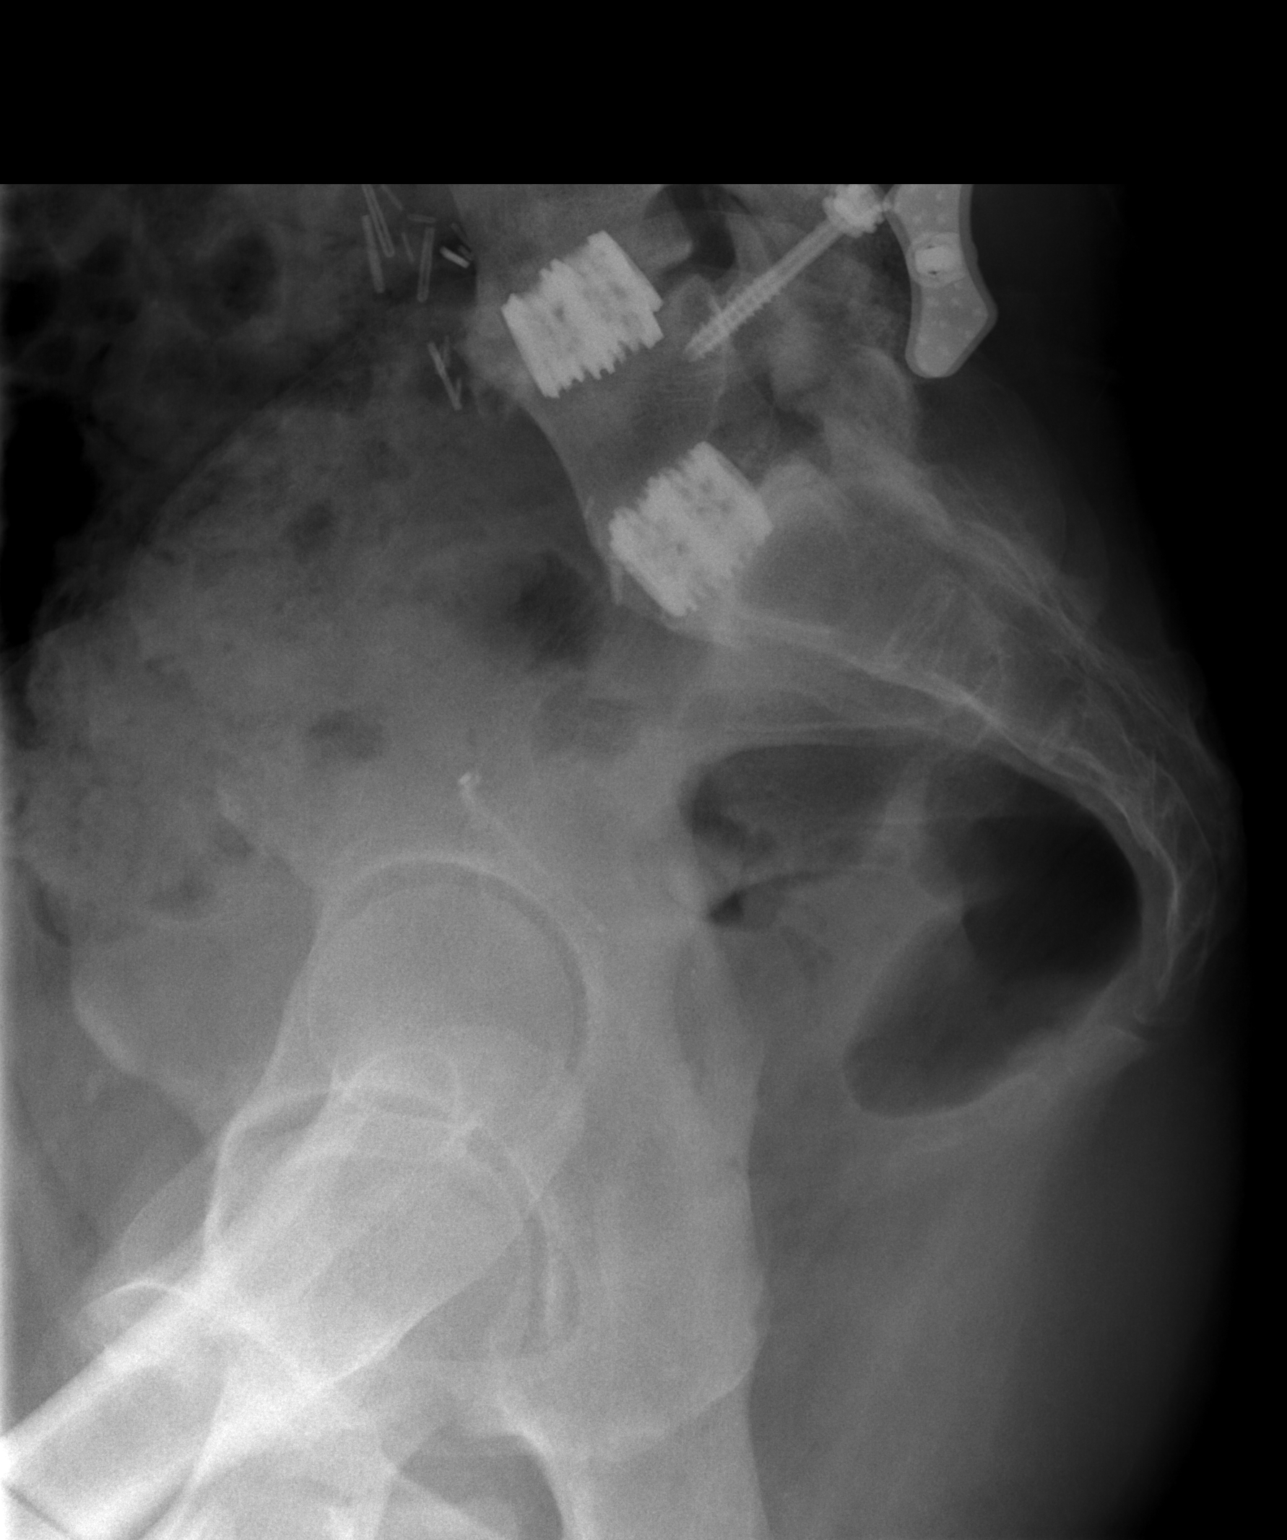

[3 of 3 positions shown; findings below may reference images not displayed]

FINDINGS: Since [DATE] no change in posterior L4-5 metallic
screws and X-stop device posteriorly with posterior osseous fusion.
Ray Cage fusions L4-5 and L5-S1 are stable and unremarkable.
Remaining discs and vertebral alignment are normally maintained.
Retroperitoneal surgical clips especially at the anterior L4 level
unchanged.
IMPRESSION: 1.  Stable L4-5 and L5-S1 postoperative changes.
2.  No acute or interval significant abnormality.

## 2009-02-20 ENCOUNTER — Encounter: Admission: RE | Admit: 2009-02-20 | Discharge: 2009-02-20 | Payer: Self-pay | Admitting: Neurological Surgery

## 2009-02-20 IMAGING — CR DG LUMBAR SPINE 2-3V
3 series · 3 of 3 positions shown · non-contrast
Comparison: [DATE].

CLINICAL DATA: Back pain left flank pain.

LUMBAR SPINE - 2-3 VIEW

[t l-spine a.p.]
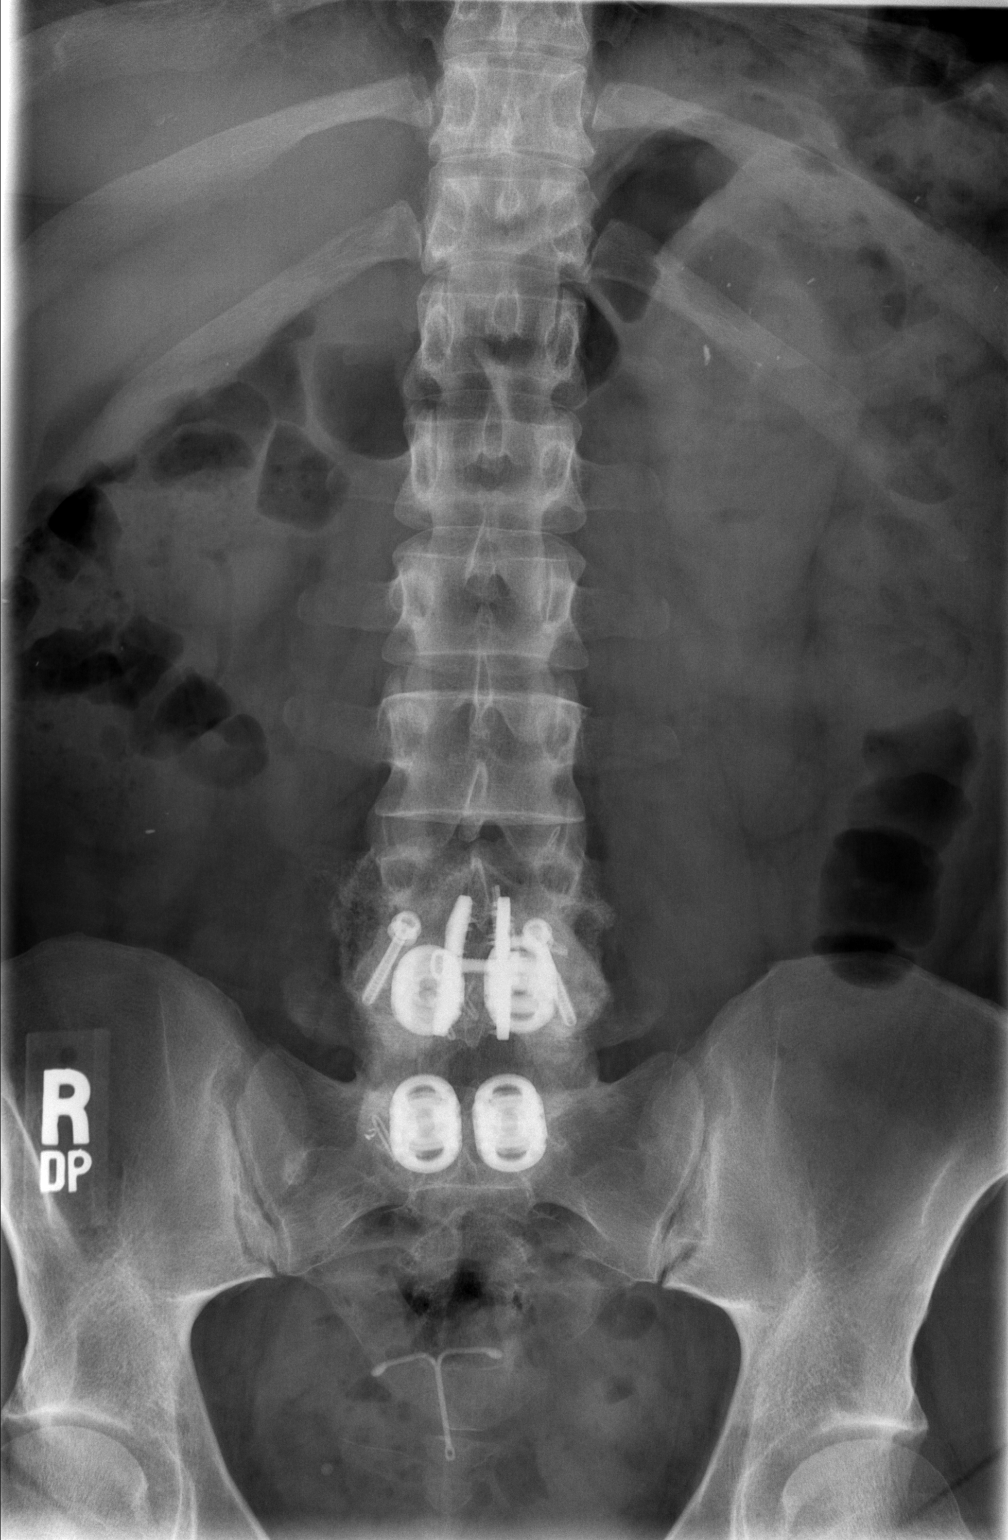

[t l-spine lat]
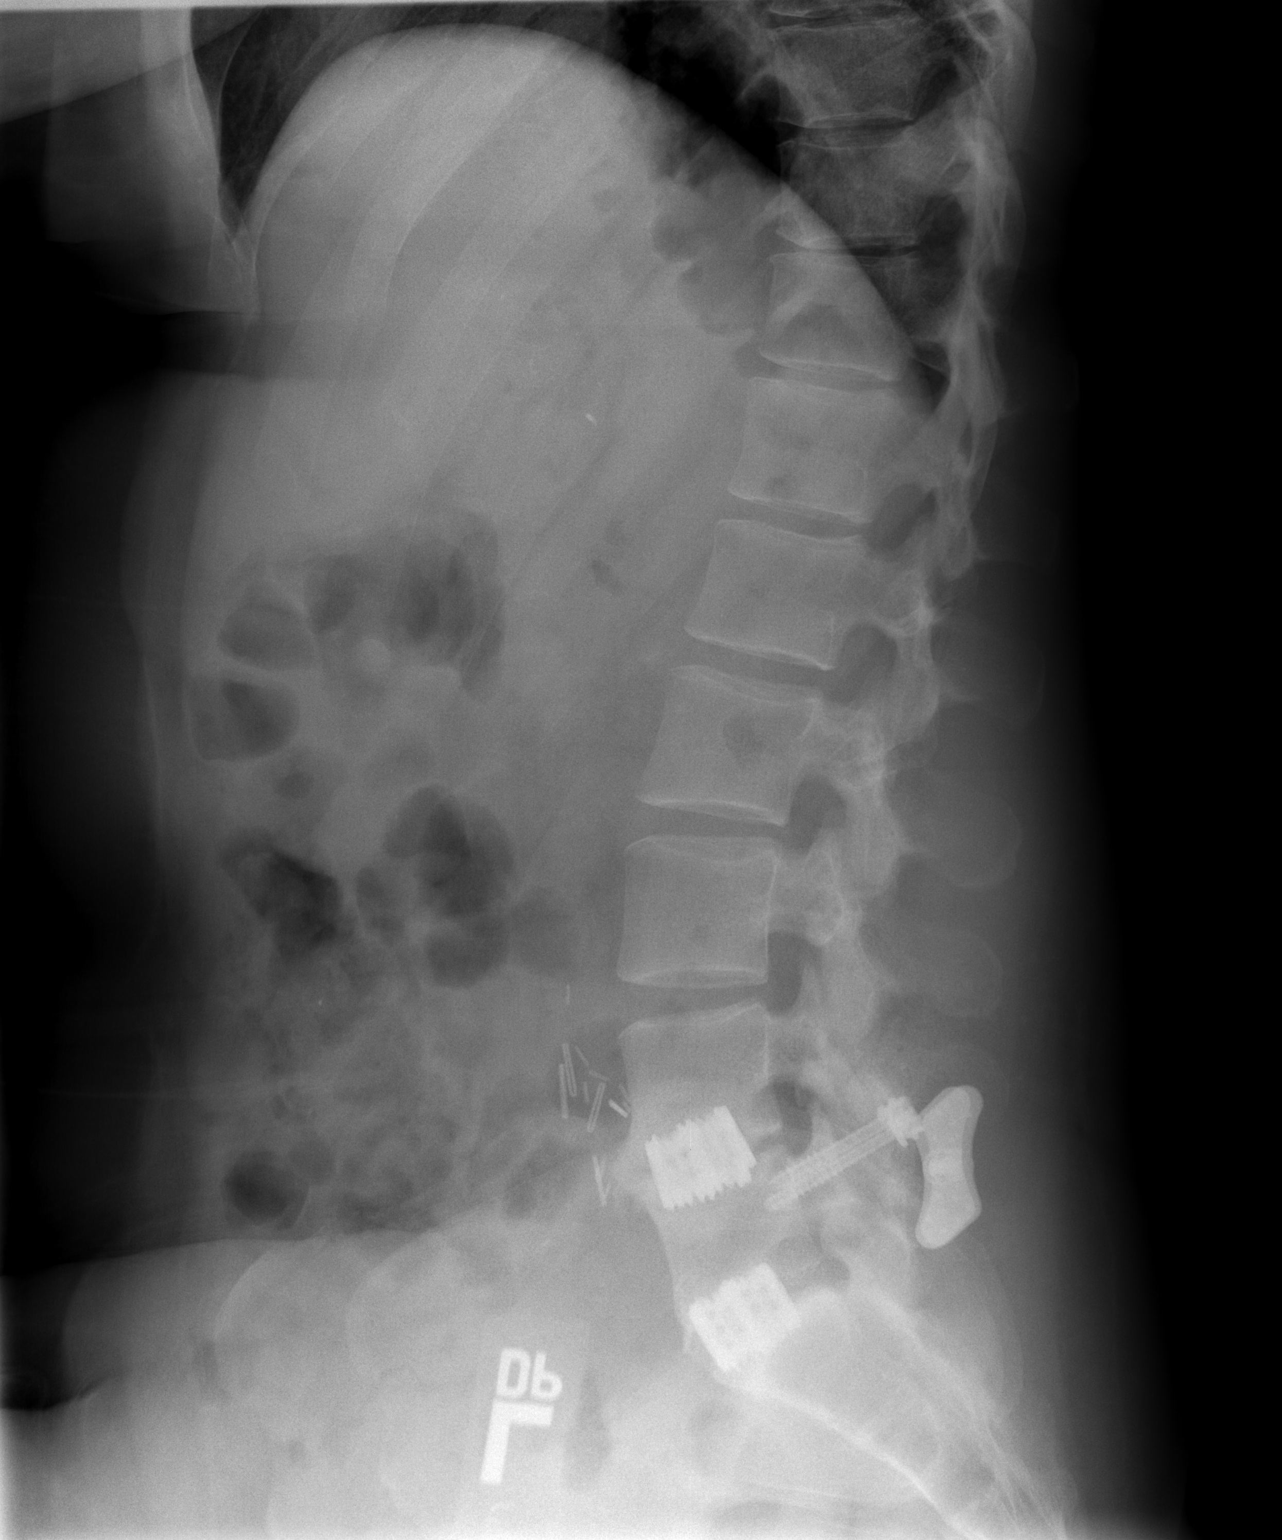

[t l-spine l5-s1 spot]
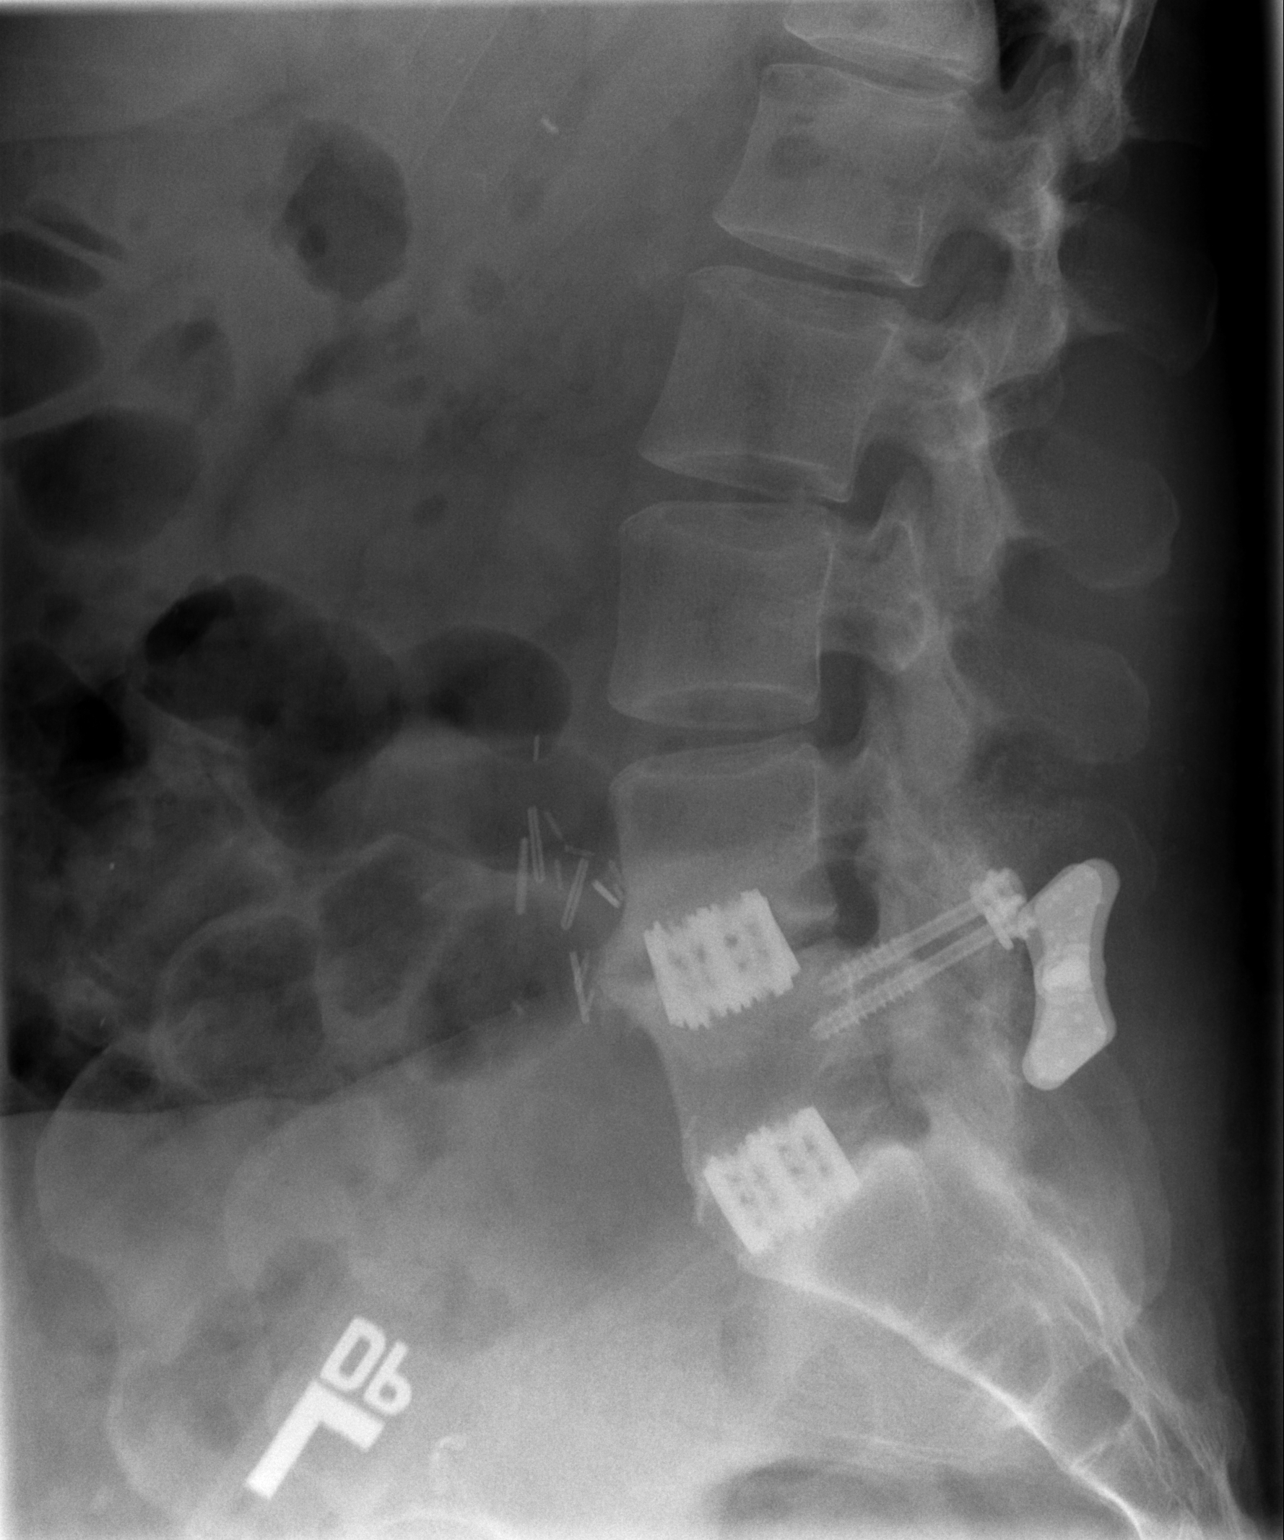

[3 of 3 positions shown; findings below may reference images not displayed]

FINDINGS: Level assignment as on prior exam with fusion at the L4-5
and L5-S1 level.  Postoperative changes stable by plain film exam.

Radiopaque material left upper quadrant of the abdomen and right
colon felt to be related to ingested material.  IUD is in place.
IMPRESSION: By plain film examination there has been no significant change in
the appearance of postoperative findings at the L4-5 and L5-S1
level.

## 2009-05-22 ENCOUNTER — Encounter: Admission: RE | Admit: 2009-05-22 | Discharge: 2009-05-22 | Payer: Self-pay | Admitting: Neurological Surgery

## 2009-05-22 IMAGING — CR DG LUMBAR SPINE 2-3V
3 series · 3 of 3 positions shown · non-contrast
Comparison: [DATE]

CLINICAL DATA: Low back pain.

LUMBAR SPINE - 2-3 VIEW

[t l-spine a.p.]
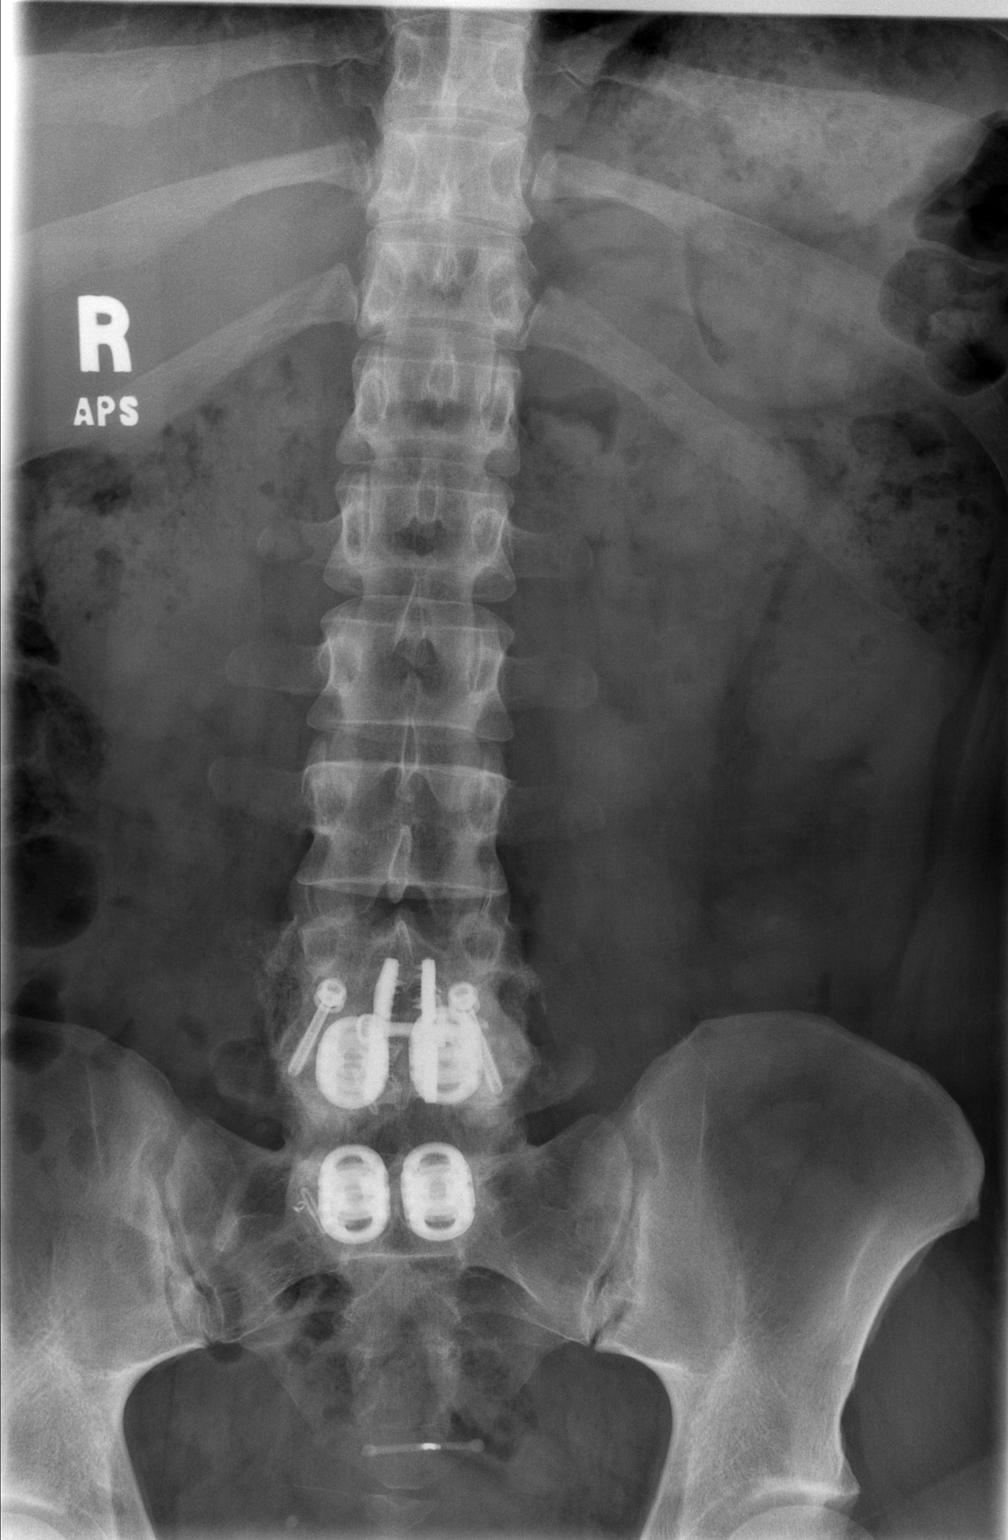

[t l-spine lat]
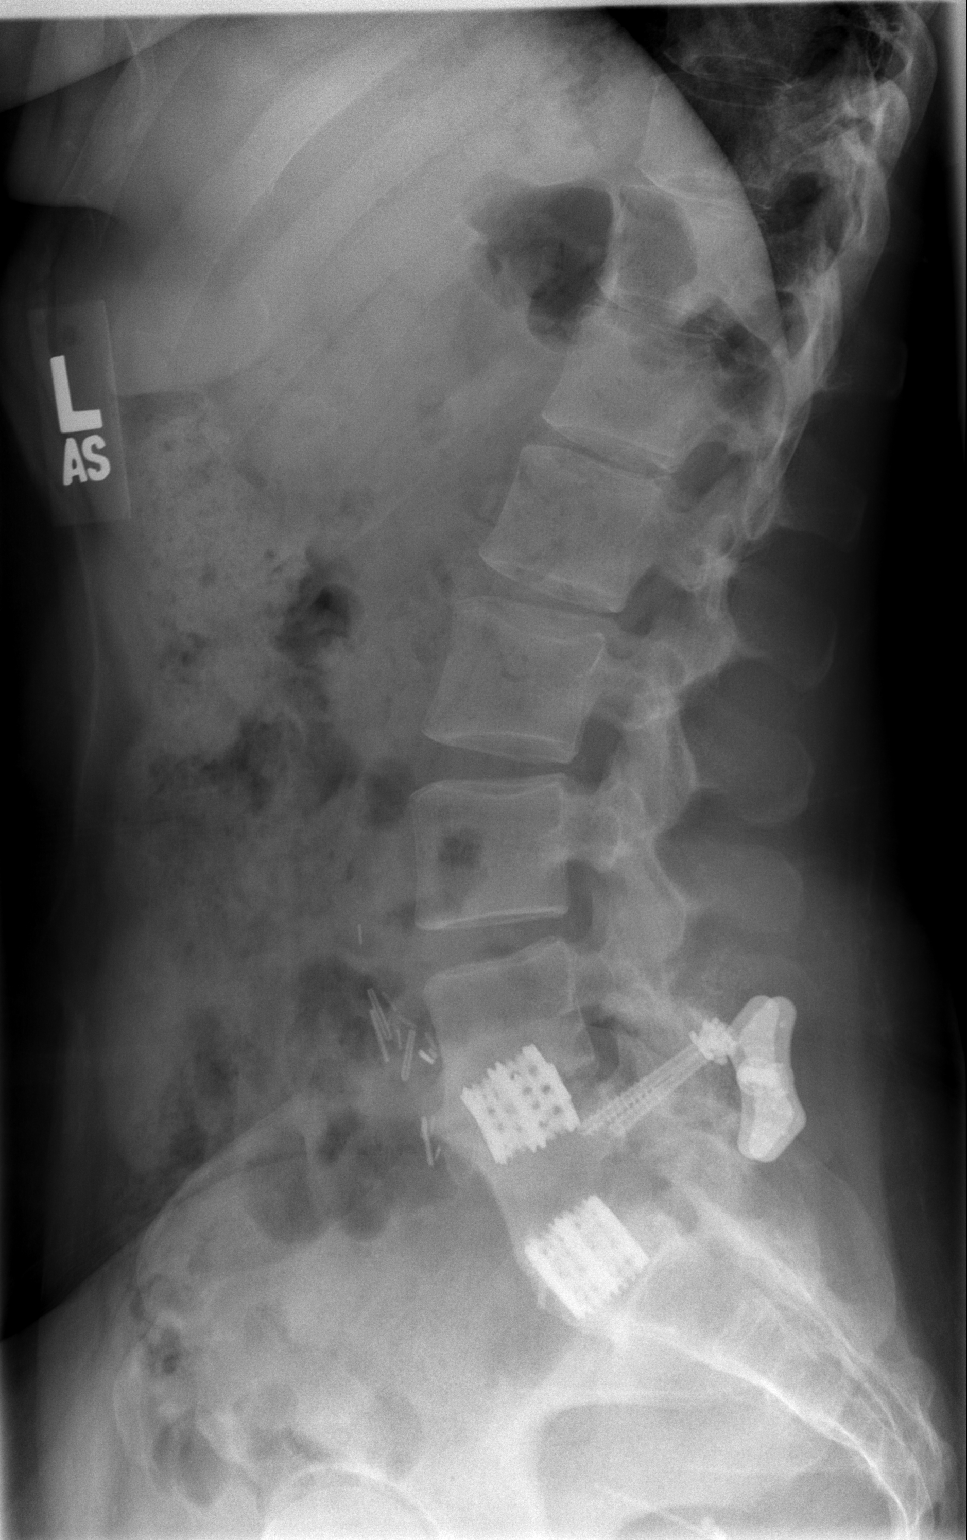

[t l-spine l5-s1 spot]
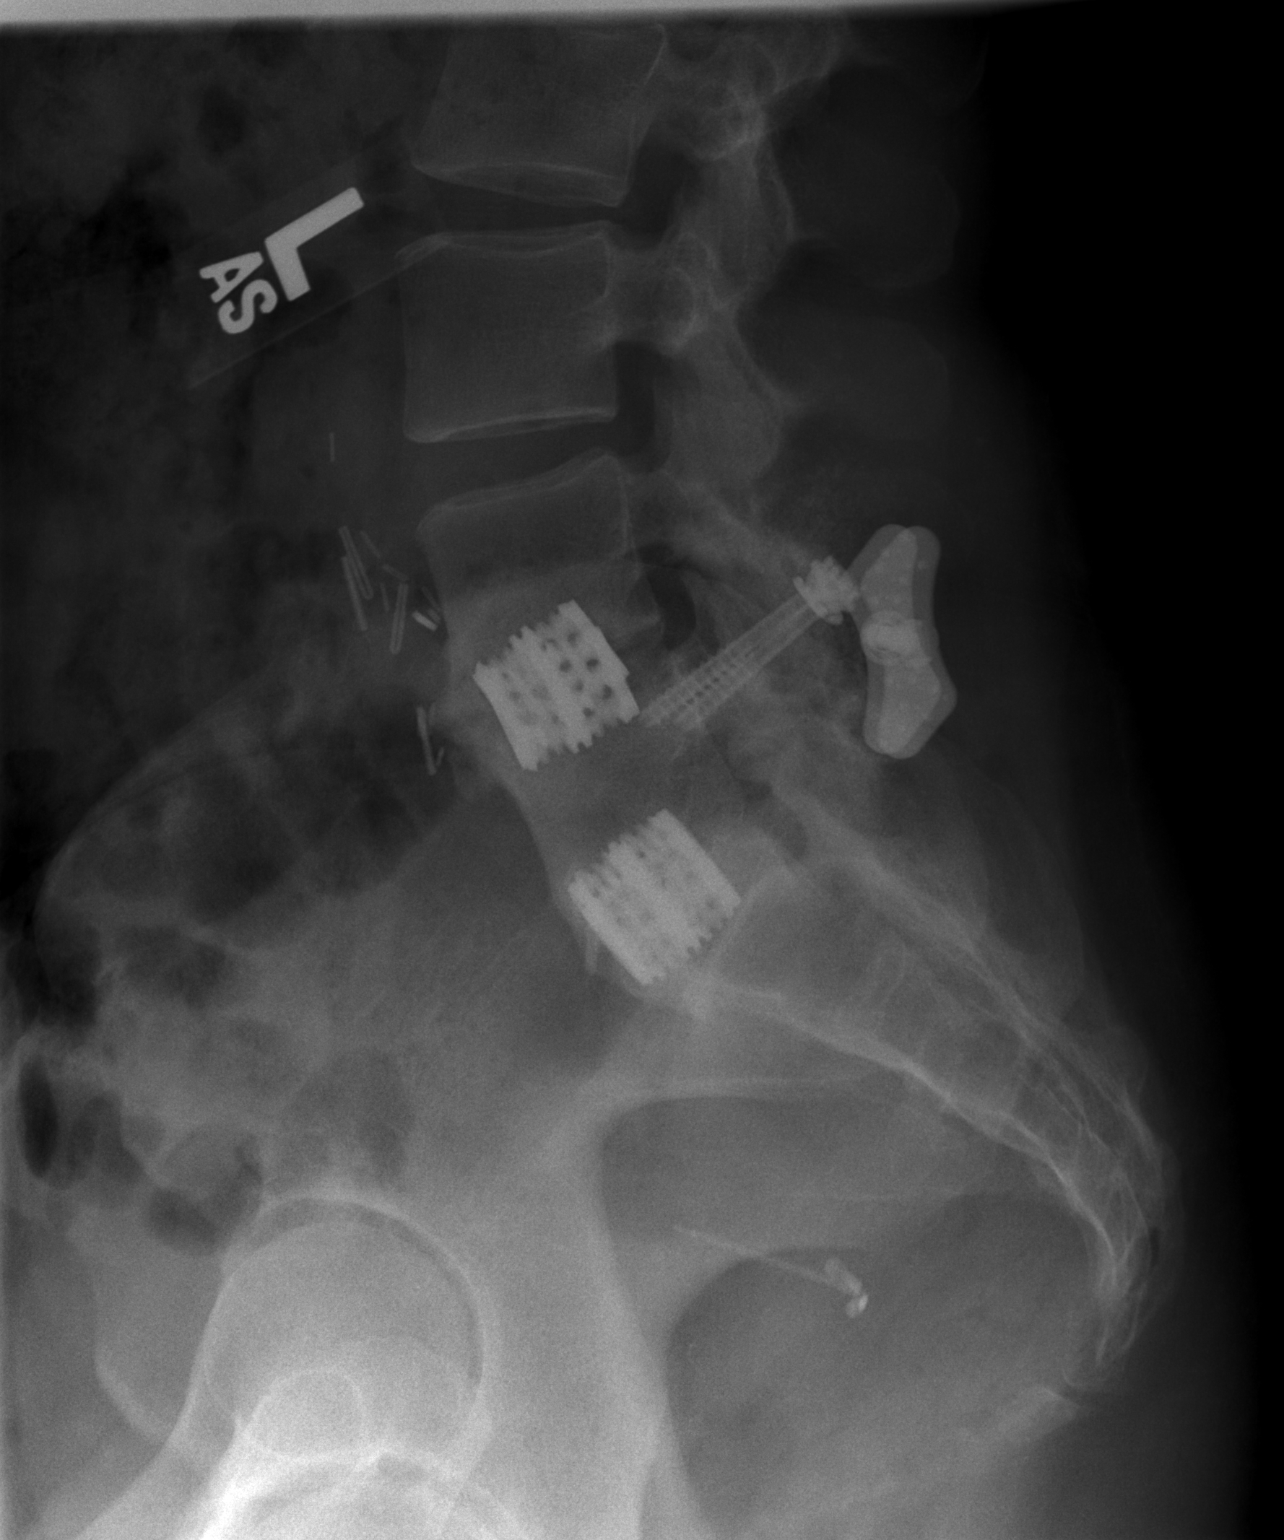

[3 of 3 positions shown; findings below may reference images not displayed]

FINDINGS: There are postop changes of fusion at L4-5 and L5-S1, as
before.  No interval change.  Alignment is anatomic.  An
intrauterine contraceptive device is seen in the anatomic pelvis.
IMPRESSION: L4-S1 fusion without interval change.

## 2009-11-21 ENCOUNTER — Encounter: Admission: RE | Admit: 2009-11-21 | Discharge: 2009-11-21 | Payer: Self-pay | Admitting: Neurological Surgery

## 2009-11-21 IMAGING — CT CT L SPINE W/O CM
4 of 10 series · 10 of 33 positions shown, 12 images · non-contrast
Comparison: [DATE]

CLINICAL DATA: Low back pain.  Previous fusion surgery.

CT LUMBAR SPINE WITHOUT CONTRAST
TECHNIQUE: Multidetector CT imaging of the lumbar spine was
performed without intravenous contrast administration. Multiplanar
CT image reconstructions were also generated.

[Series 3: l spine bone · axial · 0.27mm/px · z∈[-171,-94]mm · 2 of 95 slices shown, 3 images]
[im 32/95  soft-tissue]
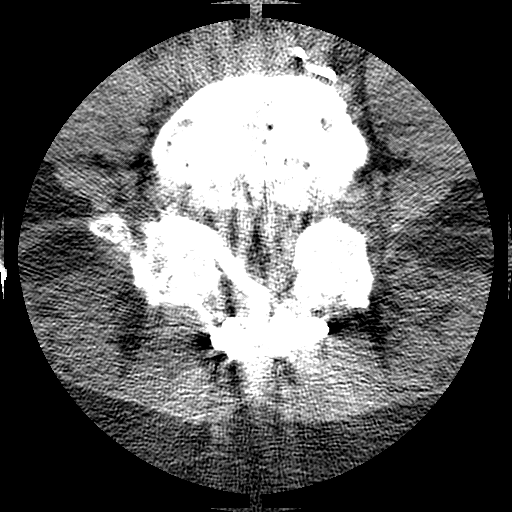
[im 32/95  bone]
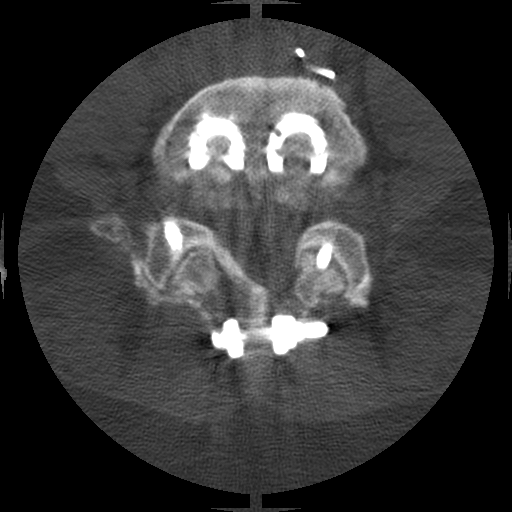
[im 63/95  bone]
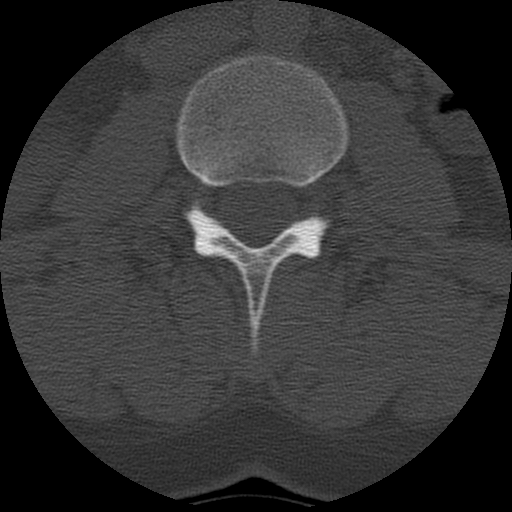

[Series 4: l spine soft · axial · 0.27mm/px · z∈[-171,-94]mm · 2 of 95 slices shown]
[im 32/95  soft-tissue]
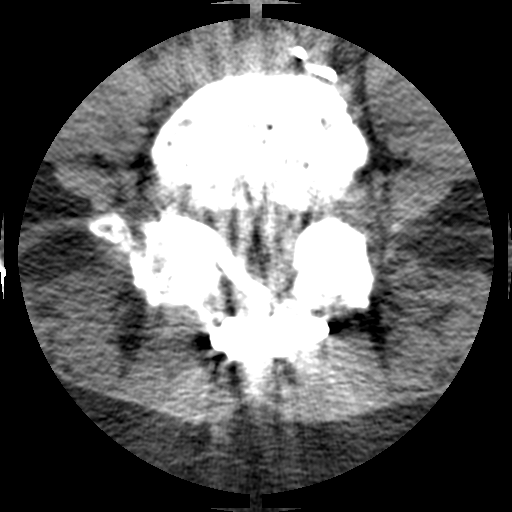
[im 63/95  soft-tissue]
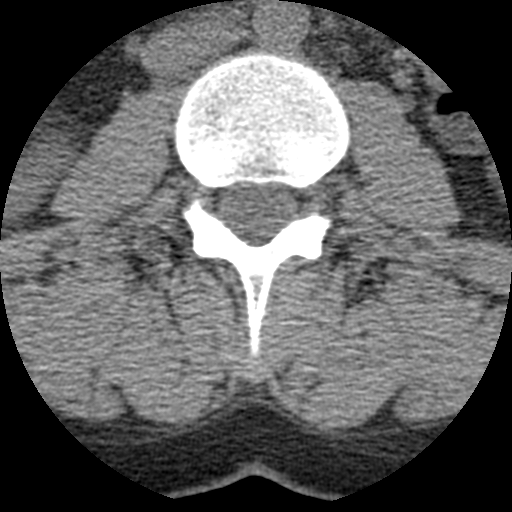

[Series 104: cor lower l-spine · coronal · 0.47mm/px · 1 of 44 slices shown]
[im 22/44  bone]
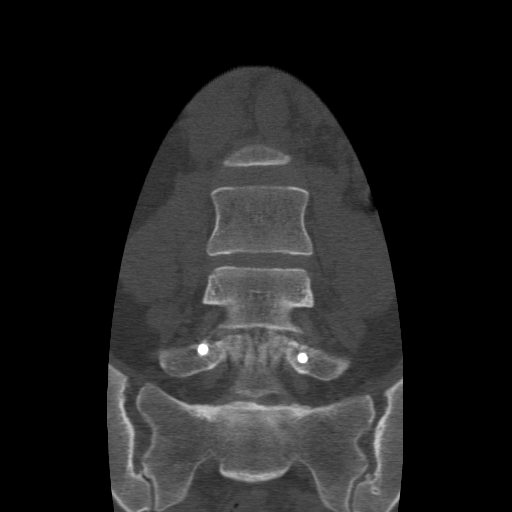

[Series 105: sag l-spine · sagittal · 0.47mm/px · 5 of 44 slices shown, 6 images]
[im 15/44  bone]
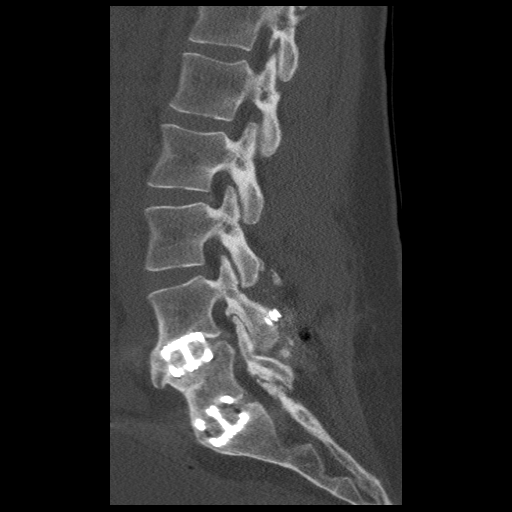
[im 18/44  bone]
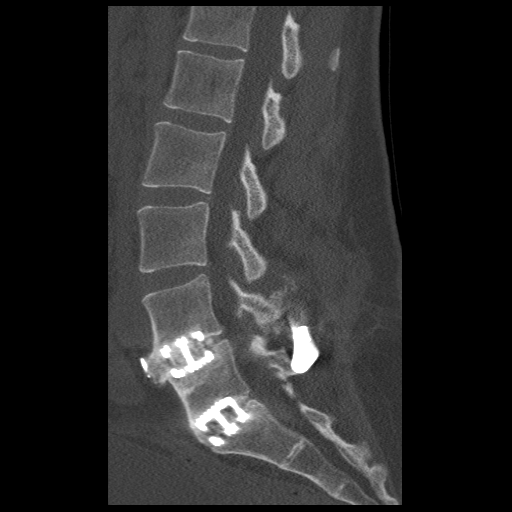
[im 22/44  soft-tissue]
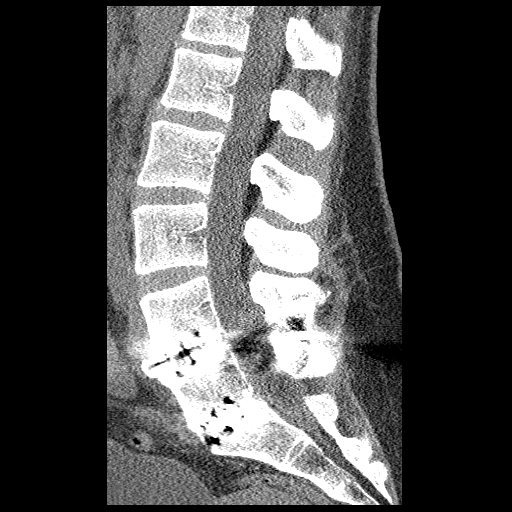
[im 22/44  bone]
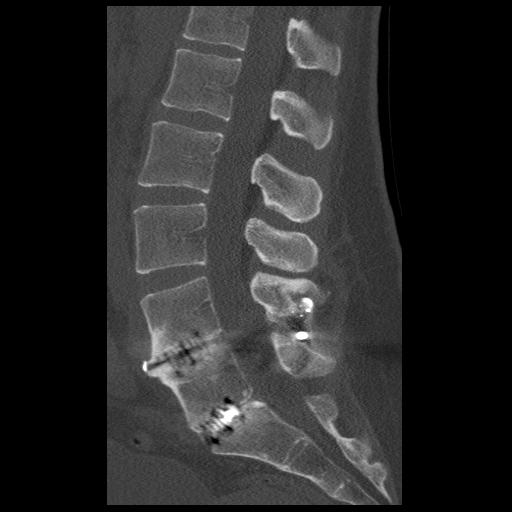
[im 26/44  bone]
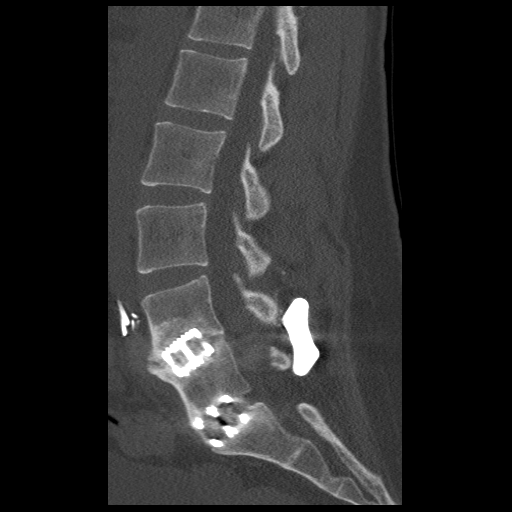
[im 29/44  bone]
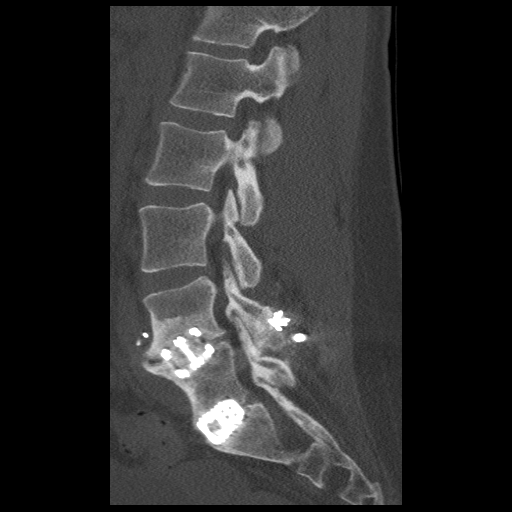

[10 of 33 positions shown; findings below may reference images not displayed]

FINDINGS: T12 L1:  Negative

L1-2:  Minimal circumferential disc bulge without central canal or
foraminal encroachment.

L2-3:  Mild circumferential disc bulge.  Early facet degenerative
hypertrophy.  Central canal and neural foramina widely patent.
Little change from previous study.

L3-4:  Mild circumferential disc bulge.  Mild bilateral facet
degenerative hypertrophy.  No significant narrowing of the central
canal or neural foramina.  Stable appearance since prior study.

L4-5:  Stable Ray cages in the interspace with significant
subsidence.  No convincing bony bridging across the interspace.
There has been interval placement of 2 screws across the facets
bilaterally and placement of an x-stop device across the spinous
processes.  There are  bone graft fragments posterior to the
posterior elements without convincing interval   solid bony
bridging.  The alignment is preserved.

L5-S1:  Stable changes of interbody fusion with paired Ray cages.
There is significant subsidence as before.  Alignment is preserved.
IMPRESSION: 1.  Small disc bulges L2-3 L3-4 without compressive pathology.
2.  Interval instrumented posterior fusion L4-5 as above with no
apparent complication, preserved alignment.  No convincing bony
bridging.
3.  Stable changes of instrumented interbody fusion L4-5 and L5-S1.

## 2009-12-13 ENCOUNTER — Encounter: Admission: RE | Admit: 2009-12-13 | Discharge: 2009-12-13 | Payer: Self-pay | Admitting: Surgery

## 2009-12-13 IMAGING — US US SOFT TISSUE HEAD/NECK
1 series · 14 of 25 positions shown · non-contrast
Comparison: [DATE] and earlier.

CLINICAL DATA: 33-year-old female with thyroid nodules.

THYROID ULTRASOUND
TECHNIQUE: Ultrasound examination of the thyroid gland and
adjacent soft tissues was performed.

[Series 1: us soft tissue head/neck · 0.08mm/px · 14 of 26 slices shown]
[im 1/26]
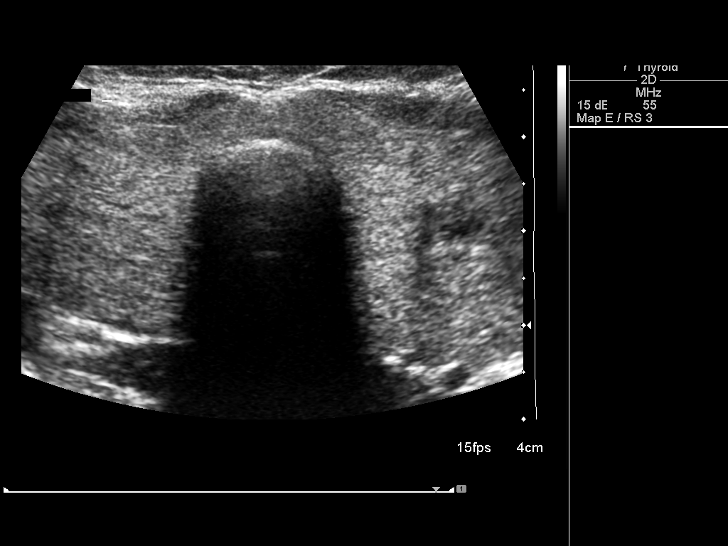
[im 3/26]
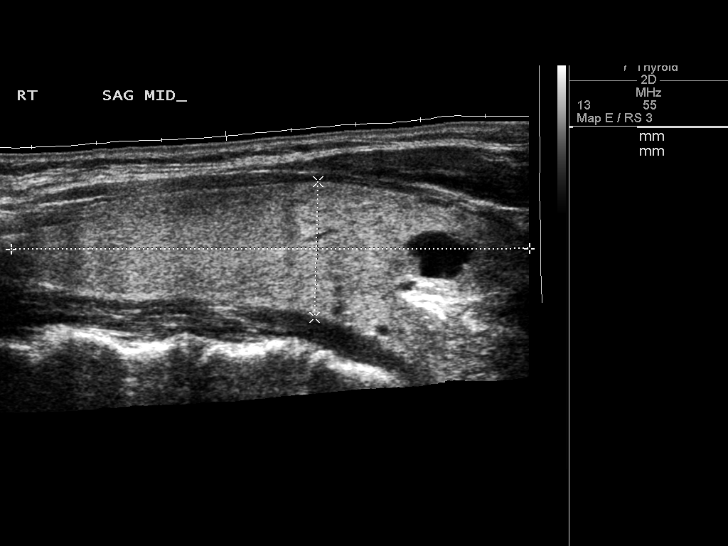
[im 5/26]
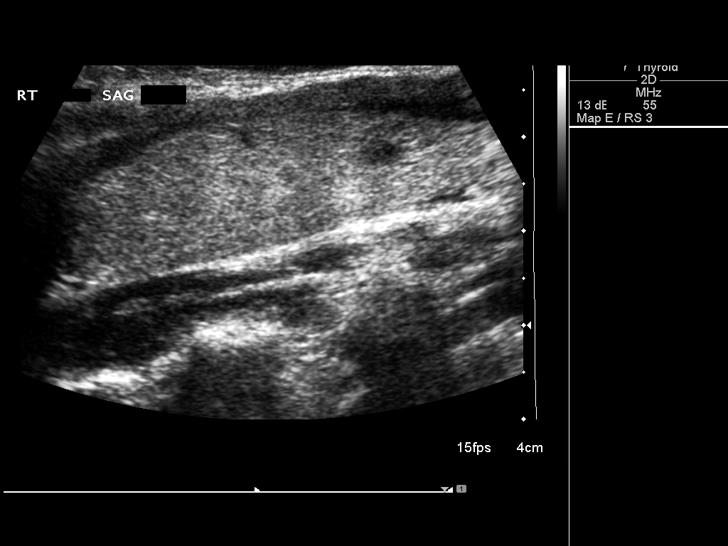
[im 7/26]
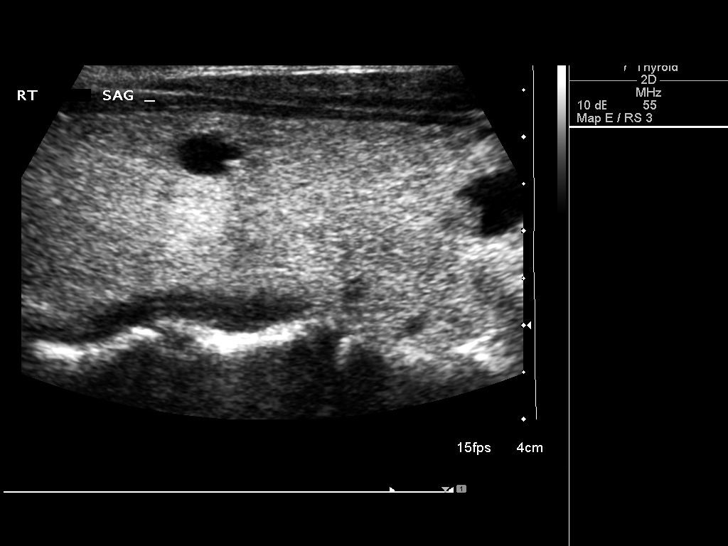
[im 9/26]
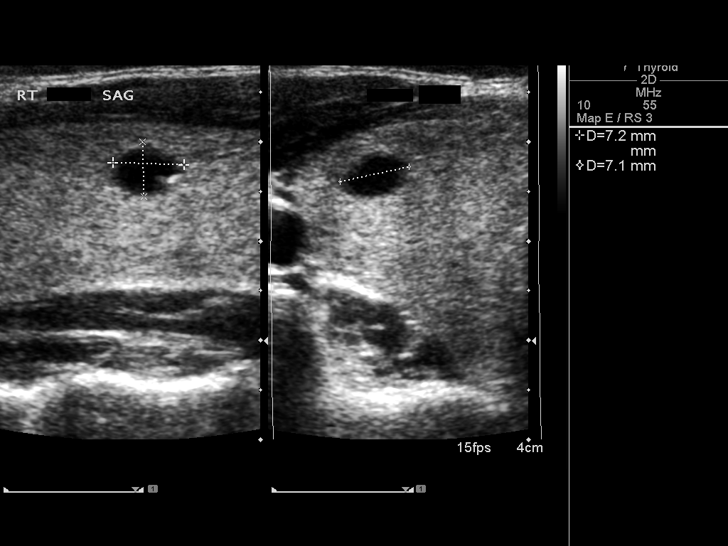
[im 10/26]
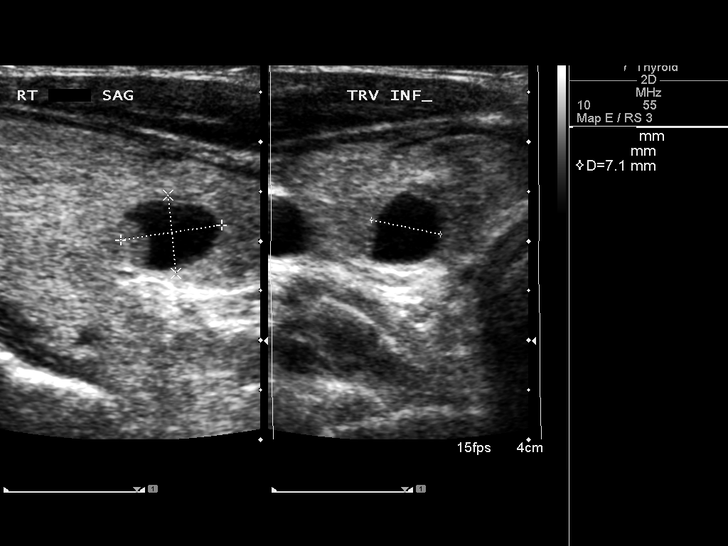
[im 12/26]
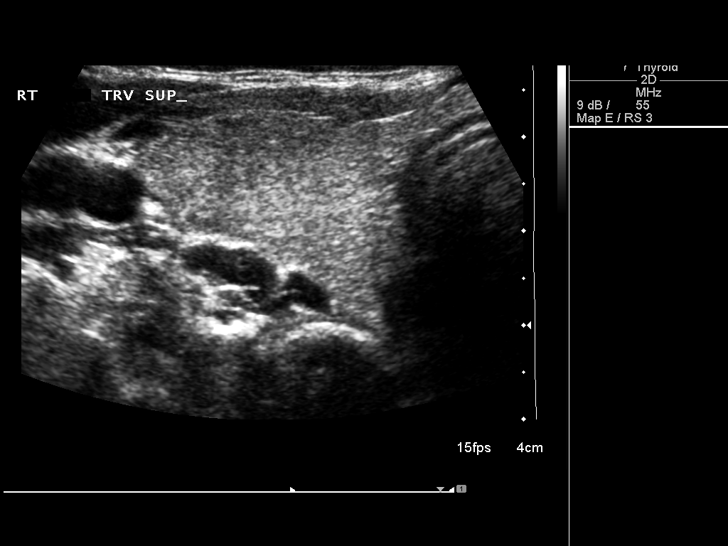
[im 14/26]
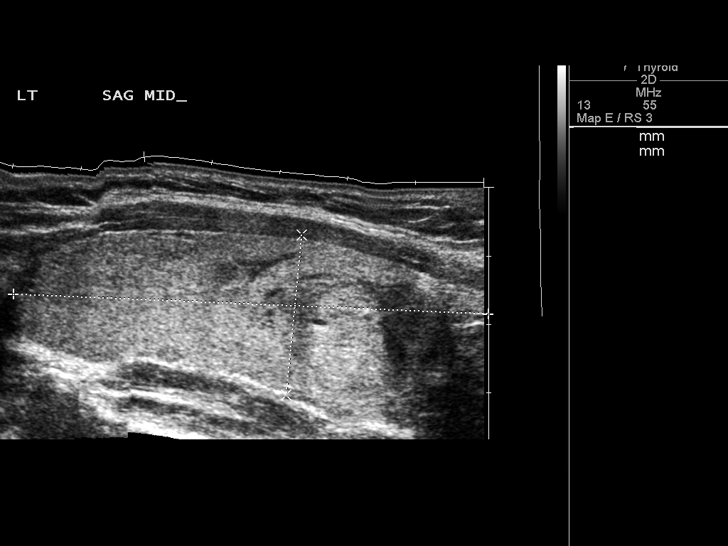
[im 16/26]
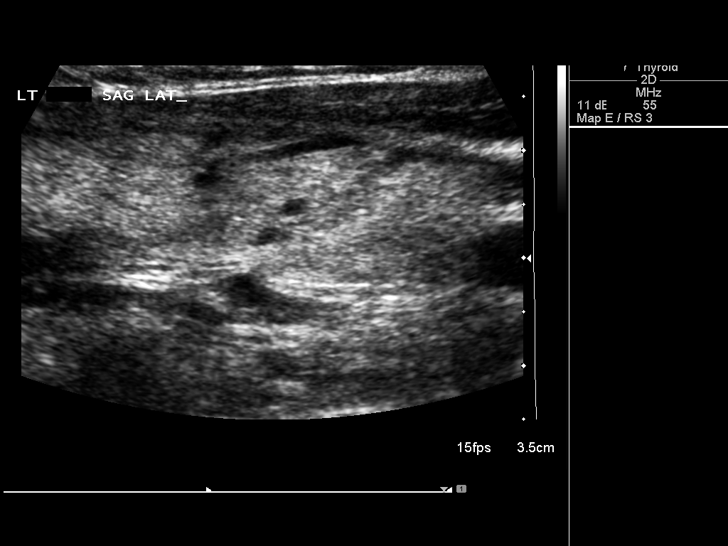
[im 17/26]
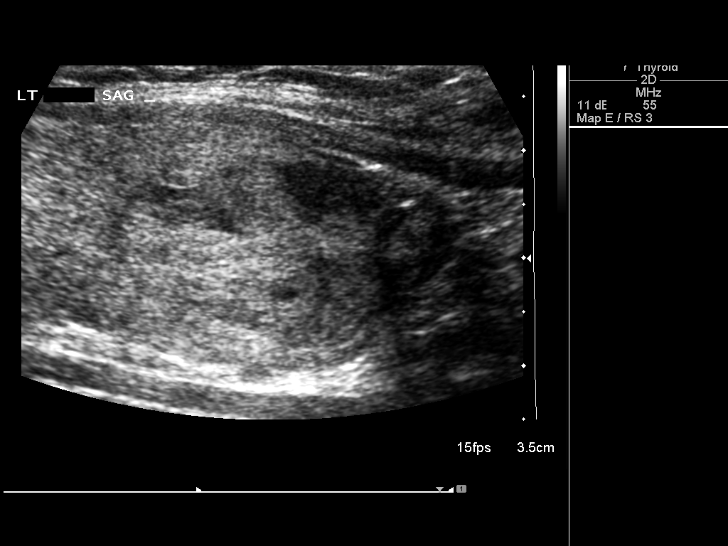
[im 19/26]
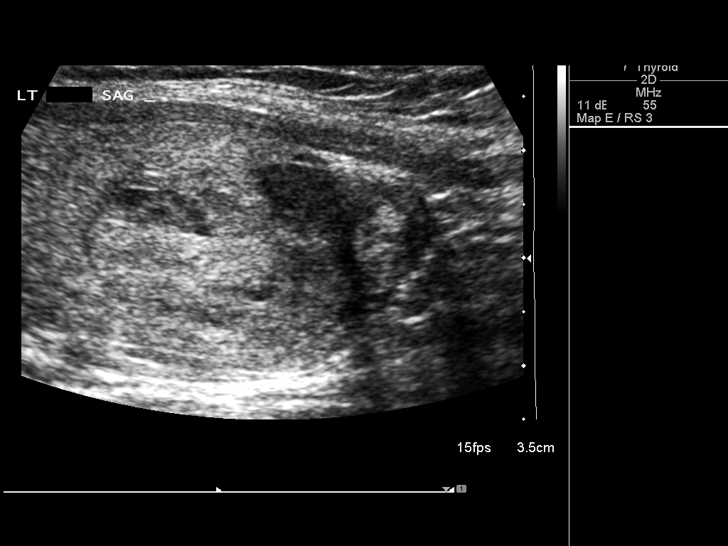
[im 21/26]
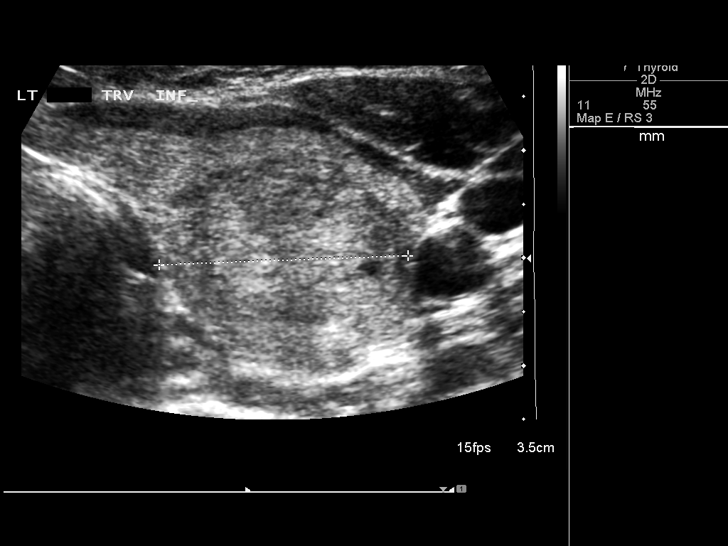
[im 23/26]
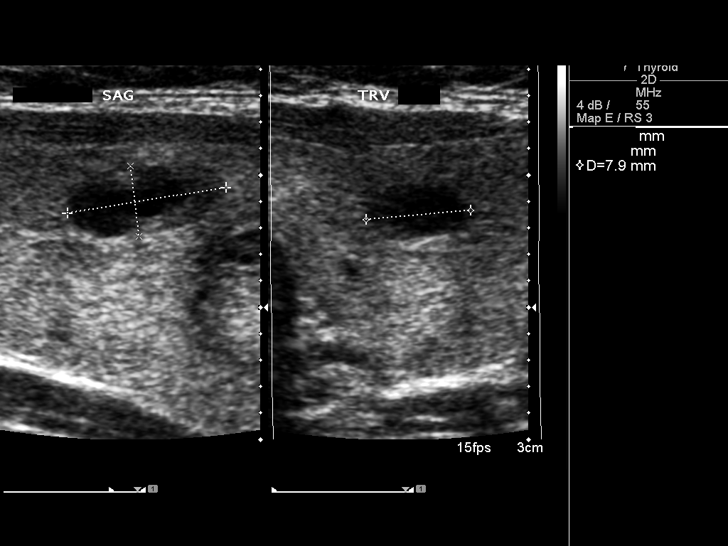
[im 26/26]
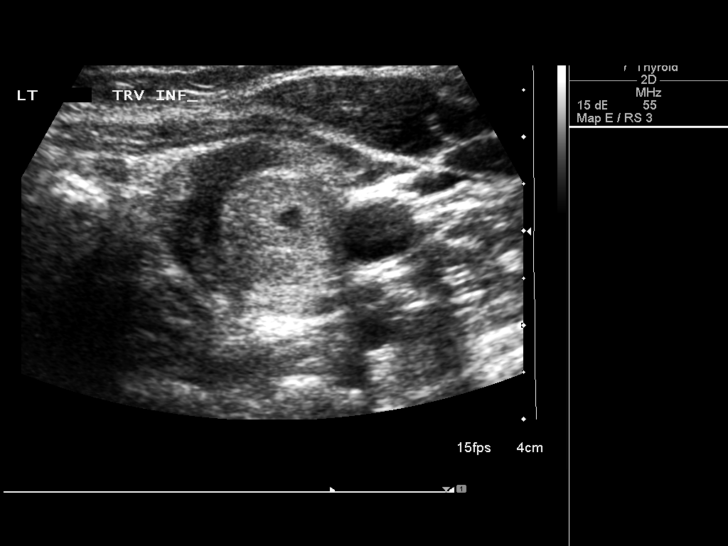

[14 of 25 positions shown; findings below may reference images not displayed]

FINDINGS: The right thyroid lobe today measures 8.0 x 2.1 x 2.6 cm
(previously 6.9 x 2.1 x 2.8 cm).  The left lobe measures 7.0 x
x 2.6 cm (previously 5.4 x 2.0 x 2.8 cm).  The isthmus is normal
measuring 3-4 mm (unchanged).

The complex solid nodule at the left lower pole is re-identified
and appears unchanged in echotexture.  Measures 3.4 x 2.0 x 2.3 cm
(previously 2.9 x 1.6 x 2.8 cm).

Three smaller more hypoechoic nodules are re-identified including
in the left upper pole (12 x 5 x 8 mm, unchanged), the central
right lobe (10 x 8 x 7 mm, unchanged), and the lateral right lobe
(7 x 6 x 7 mm, previously 8 x 4 x 9 mm).
IMPRESSION: 1.  Dominant solid nodule in the left lower thyroid pole measures
slightly larger at 3.4 x 2.0 x 2.3 cm (previously 2.9 x 1.6 x
cm), but is otherwise stable.
2.  Stable additional sub centimeter more cystic appearing nodules.

## 2011-02-05 LAB — BASIC METABOLIC PANEL
BUN: 16 mg/dL (ref 6–23)
GFR calc Af Amer: >60 mL/min (ref >60)
GFR calc non Af Amer: >60 mL/min (ref >60)
Potassium: 4.6 mEq/L (ref 3.5–5.1)
Sodium: 139 mEq/L (ref 135–145)

## 2011-02-05 LAB — CBC
MCHC: 34.2 g/dL (ref 30.0–36.0)
MCV: 96.1 fL (ref 78.0–100.0)
RDW: 13 % (ref 11.5–15.5)

## 2011-02-05 LAB — TYPE AND SCREEN
ABO/RH(D): O POS
Antibody Screen: NEGATIVE

## 2011-02-05 LAB — DIFFERENTIAL
Eosinophils Absolute: 0.2 10*3/uL (ref 0.0–0.7)
Monocytes Relative: 7 % (ref 3–12)
Neutrophils Relative %: 73 % (ref 43–77)

## 2011-02-05 LAB — PROTIME-INR: INR: 1 (ref 0.00–1.49)

## 2011-02-05 LAB — ABO/RH: ABO/RH(D): O POS

## 2011-03-05 NOTE — Op Note (Signed)
NAME:  Mendez, Kristy              ACCOUNT NO.:  [id number]   MEDICAL RECORD NO.:  [id number]          PATIENT TYPE:  INP   LOCATION:  3033                         FACILITY:  [facility]   PHYSICIAN:  Tia Alert, MD     DATE OF BIRTH:  01/21/1976   DATE OF PROCEDURE:  11/24/2008  DATE OF DISCHARGE:                               OPERATIVE REPORT   PREOPERATIVE DIAGNOSIS:  Pseudoarthrosis, L4-5 with back and leg pain.   POSTOPERATIVE DIAGNOSIS:  Pseudoarthrosis, L4-5 with back and leg pain.   PROCEDURE:  1. Lumbar re-exploration with exploration of fusion, L4-5.  2. Posterolateral fusion, L4-5 utilizing BMP-soaked sponges and      Actifuse putty soaked with bone marrow aspirate taken through a      separate fascial incision over the left iliac crest.  3. Interspinous plating, L4-5, utilizing the NuVasive interspinous      plate.  4. Nonsegmental fixation, L4-5 utilizing bilateral transfacet screws.   SURGEON:  Tia Alert, MD   ASSISTANT:  Reinaldo Meeker, MD   ANESTHESIA:  General endotracheal.   COMPLICATIONS:  None apparent.   INDICATIONS FOR PROCEDURE:  Kristy Mendez is a 35 year old female who  underwent a previous decompressive laminectomy at L4-5 and L5-S1 on the  left in the remote past.  She then underwent a 2-level anterior lumbar  interbody fusion at [facility] utilizing interbody cages.  She  presented with a many year history of progressively worsening back pain.  She had a CT scan and flexion/extension films which showed a solid  fusion at L5-S1, but a mobile pseudoarthrosis at L4-5.  I recommended a  redo fusion at L4-5 with posterior approach.  She understood the risks,  benefits, and expected outcome and wished to proceed.   DESCRIPTION OF PROCEDURE:  The patient was taken to the operating room  and after induction of adequate generalized endotracheal anesthesia, she  was rolled in the prone position on chest rolls and all pressure points  were  padded.  Her lumbar region was prepped with DuraPrep and then  draped in usual sterile fashion.  A 5 mL of local anesthesia was  injected, and a dorsal midline incision was made and carried down to the  fascia.  The fascia was opened and the paraspinous musculature was taken  down in a subperiosteal fashion to expose L4-5.  Intraoperative  fluoroscopy confirmed my level, and I took the dissection out over the  facets to expose the transverse processes of L4 and L5.  I then went to  the left iliac crest, made to separate fascial incision, and utilizing a  large bone marrow aspiration needle, aspirated about 8 mL of bone marrow  from the left iliac crest.  I then closed this fascial incision and  soaked Actifuse putty with this bone marrow aspirate.  We turned our  attention to the nonsegmental fixation utilizing AP and lateral  fluoroscopy.  We placed our transfacet screws.  We started our screw at  the superior medial aspect of the line drawn between the medial borders  of the L3 and L4 pedicles.  We drilled in a downward and outward  direction utilizing a drill guide and the hand drill utilizing AP and  lateral fluoroscopy.  Once we had drilled halfway down the pedicle, we  probed the pedicle and checked this again with AP fluoroscopy on each  side.  I then used the EMG monitoring to help assure adequate placement  of the transfacet screws.  We drilled to a depth of 30 mm on the  patient's right and 35 mm on the patient's left.  We tapped each over a  K-wire after stimulating K-wire to assure that we were not close to any  neural structures.  I placed a 35-mm transfacet screw on the left and a  30-mm transfacet screw on the right.  We then decorticated transverse  processes and laid a mixture of BMP-soaked sponges and bone marrow  soaked Actifuse putty out over these to perform intertransverse  arthrodesis at L4-5.  We then placed an interspinous plate at V7-8 to  help solidify our  construct.  I then irrigated with saline solution  containing bacitracin and dried all bleeding points.  I placed a medium  Hemovac drain through a separate stab incision and then closed the  fascia with 0 Vicryl, closed the subcutaneous and subcuticular tissue  with 2-0 and 3-0 Vicryl, and closed the skin with Benzoin and Steri-  Strips.  The drapes were removed.  A sterile dressing was applied.  The  patient was awakened from general anesthesia and transferred to the  recovery room in stable condition.  At the end of the procedure, all  sponge, needle, and instrument counts were correct.      Tia Alert, MD  Electronically Signed     DSJ/MEDQ  D:  11/24/2008  T:  11/24/2008  Job:  [id number]

## 2011-03-08 NOTE — Discharge Summary (Signed)
NAME:  Kristy Mendez, Kristy Mendez              ACCOUNT NO.:  [id number]   MEDICAL RECORD NO.:  [id number]          PATIENT TYPE:  INP   LOCATION:  3033                         FACILITY:  [facility]   PHYSICIAN:  Tia Alert, MD     DATE OF BIRTH:  10-07-1976   DATE OF ADMISSION:  11/24/2008  DATE OF DISCHARGE:  11/27/2008                               DISCHARGE SUMMARY   ADMITTING DIAGNOSIS:  Pseudoarthrosis, L4-5.   PROCEDURE:  Redo instrumented fusion, L4-5.   BRIEF HISTORY OF PRESENT ILLNESS:  Kristy Mendez is a 35 year old female  who underwent a 2-level ALIF with BAK cages in the remote past who  presented with severe back pain.  She had a CT scan which showed a solid  fusion at L5-S1 with a pseudoarthrosis with subsidence of the cages at  L4-5.  I felt this was likely the cause of her mechanical back pain.  I  recommended lumbar reexploration with repeat posterior fusion with  segmental fixation.  She understood the risks, benefits, and expected  outcome and wished to proceed.   HOSPITAL COURSE:  The patient was admitted on November 24, 2008 and taken  to the operating room where she underwent a redo posterior fusion with  nonsegmental fixation.  She tolerated the procedure well, was taken to  recovery room, and then to the floor in stable condition.  For details  of the operative procedure, please see the dictated operative note.  The  patient's hospital course was fairly routine.  There were no  complications.  She did have significant postoperative soreness  requiring treatment with a PCA pain pump and oral pain medication.  She  was slow to mobilize but once she was up and around had good strength in  her legs.  Her pain became well controlled on oral pain medication.  She  remained afebrile with stable VITAL SIGNS.  Her incision remained clean,  dry, and intact.  She was discharged home in stable condition on  November 27, 2008, with plans to follow up in 2 weeks.   FINAL DIAGNOSIS:   Redo lumbar instrumented fusion, L4-5.      Tia Alert, MD  Electronically Signed     DSJ/MEDQ  D:  01/04/2009  T:  01/05/2009  Job:  [id number]

## 2012-06-11 ENCOUNTER — Other Ambulatory Visit: Payer: Self-pay | Admitting: Physician Assistant

## 2012-06-11 DIAGNOSIS — M545 Low back pain, unspecified: Secondary | ICD-10-CM

## 2012-06-17 ENCOUNTER — Ambulatory Visit
Admission: RE | Admit: 2012-06-17 | Discharge: 2012-06-17 | Disposition: A | Discharge status: Home / Self Care | Payer: BC Managed Care – PPO | Source: Ambulatory Visit | Attending: Physician Assistant | Admitting: Physician Assistant

## 2012-06-17 VITALS — BP 125/82 | HR 75

## 2012-06-17 DIAGNOSIS — M545 Low back pain, unspecified: Secondary | ICD-10-CM

## 2012-06-17 IMAGING — RF DG MYELOGRAM LUMBAR
14 of 19 series · 14 of 19 positions shown · IV contrast (omnipaque)
Comparison: Most recent CT lumbar spine [DATE].  CT myelogram
[DATE].

MYELOGRAM INJECTION
TECHNIQUE: Informed consent was obtained from the patient prior to
the procedure, including potential complications of headache,
allergy, infection and pain. Specific instructions were given
regarding 24 hour bedrest post procedure to prevent post-LP
headache.  A timeout procedure was performed.  With the patient
prone, the lower back was prepped with Betadine.  1% Lidocaine was
used for local anesthesia.  Lumbar puncture was performed by the
radiologist at the L2-L3 level using a 22 gauge needle with return
of clear CSF.  15 cc of Omnipaque 180 was injected into the
subarachnoid space .

I personally performed the lumbar puncture and administered the
intrathecal contrast. I also personally supervised acquisition of
the myelogram images.
CLINICAL DATA: Low back pain.  Some left leg discomfort.  Previous
lumbar surgeries.
TECHNIQUE: Multidetector CT imaging of the lumbar spine was
performed following myelography.  Multiplanar CT image
reconstructions were also generated.

[Series 1: (hospital) · 1 of 1 slices shown]
[im 1/1]
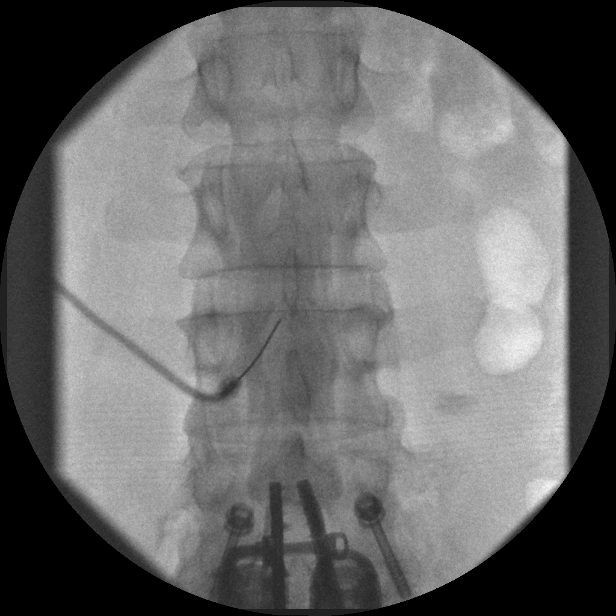

[Series 3: myelogram  white · 1 of 1 slices shown (1 of 10)]
[im 1/1]
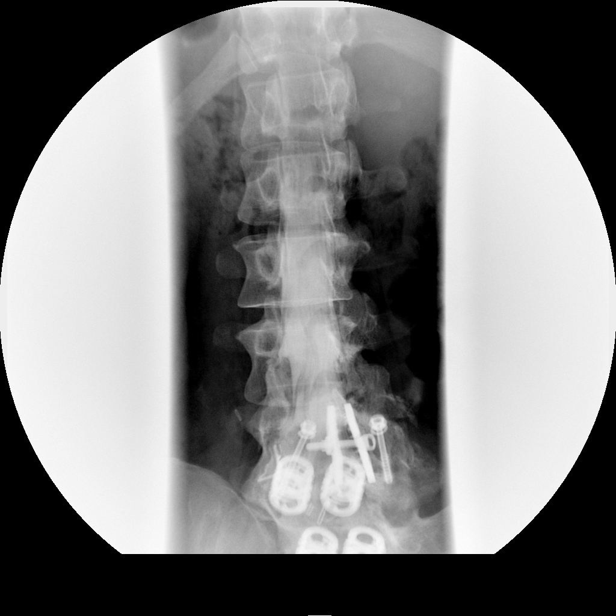

[Series 4: myelogram  white · 1 of 1 slices shown (2 of 10)]
[im 1/1]
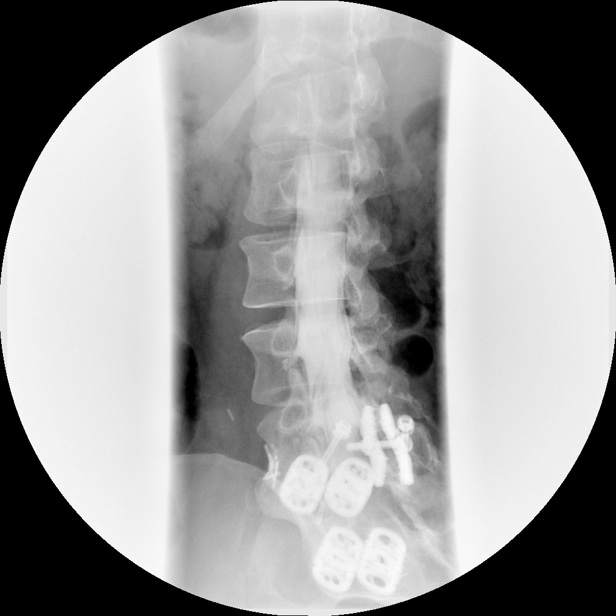

[Series 5: myelogram  white · 1 of 1 slices shown (3 of 10)]
[im 1/1]
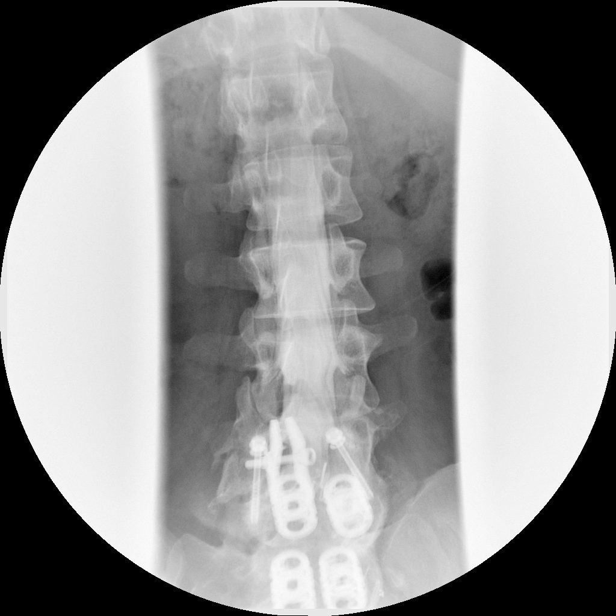

[Series 7: myelogram  white · 1 of 1 slices shown (4 of 10)]
[im 1/1]
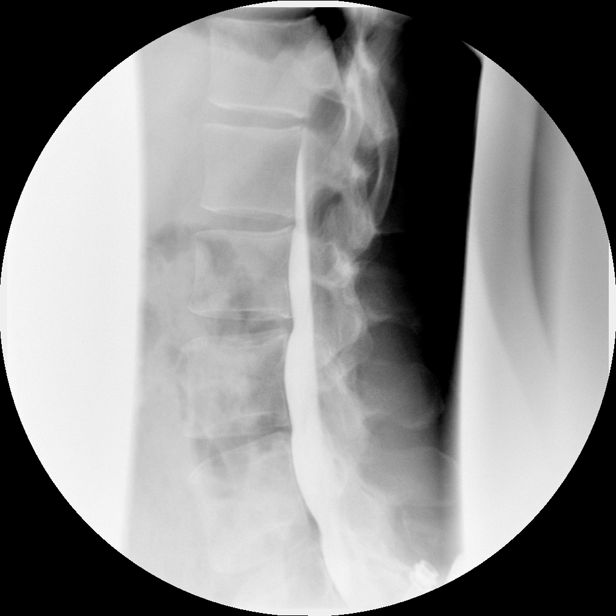

[Series 8: myelogram  white · 1 of 1 slices shown (5 of 10)]
[im 1/1]
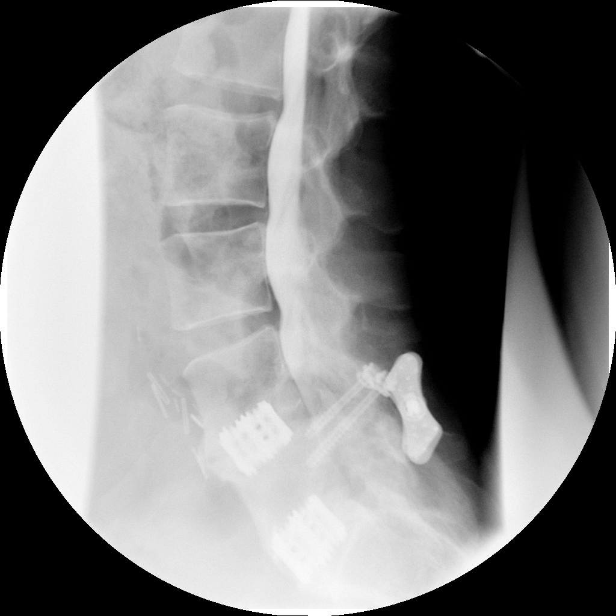

[Series 9: myelogram  white · 1 of 1 slices shown (6 of 10)]
[im 1/1]
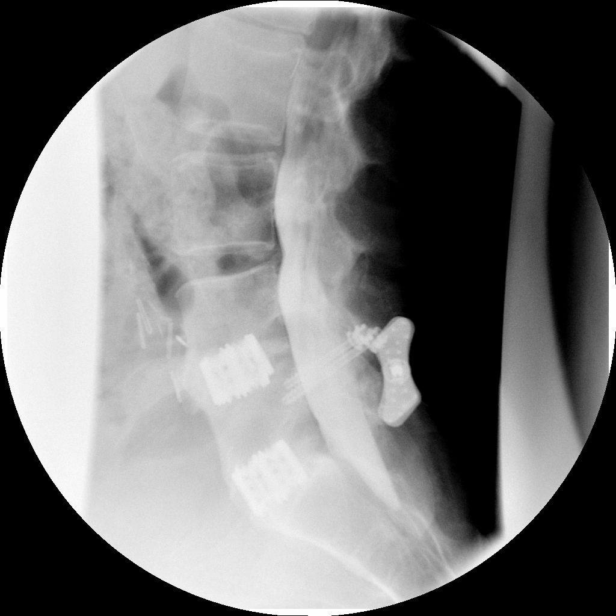

[Series 12: myelogram  white · 1 of 1 slices shown (7 of 10)]
[im 1/1]
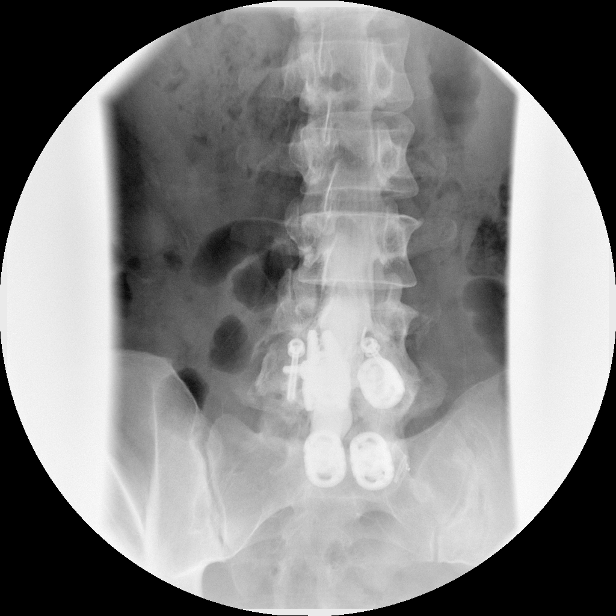

[Series 13: myelogram  white · 1 of 1 slices shown (8 of 10)]
[im 1/1]
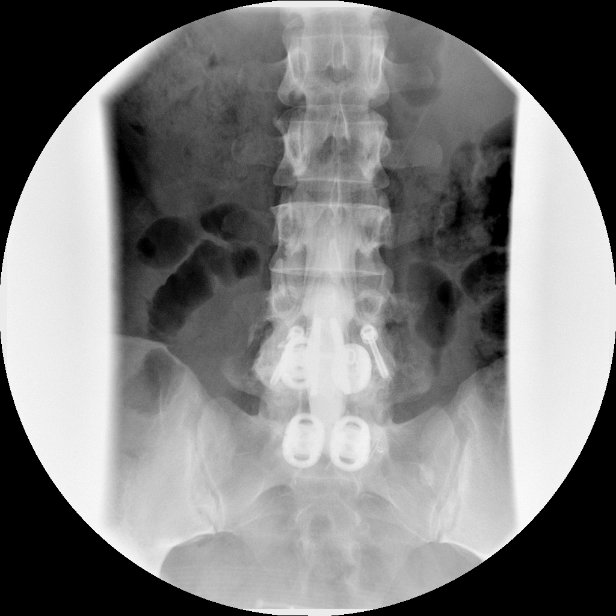

[Series 14: myelogram  white · 1 of 1 slices shown (9 of 10)]
[im 1/1]
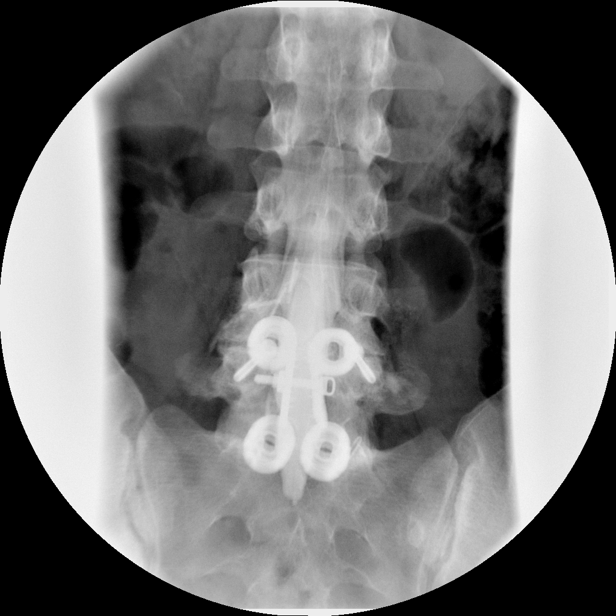

[Series 16: myelogram  white · 1 of 1 slices shown (10 of 10)]
[im 1/1]
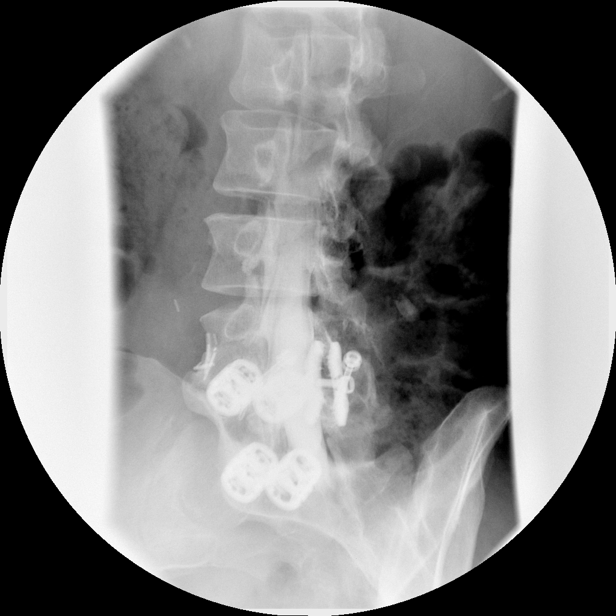

[Series 1001: view not recorded · 0.20mm/px · 1 of 1 slices shown (1 of 3)]
[im 1/1]
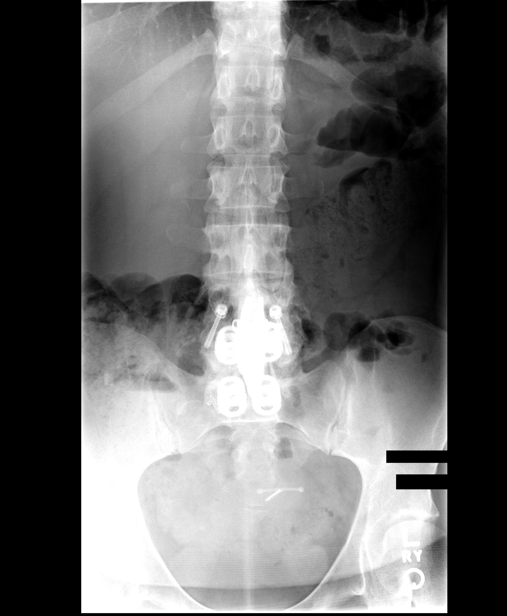

[Series 1002: view not recorded · 0.20mm/px · 1 of 1 slices shown (2 of 3)]
[im 1/1]
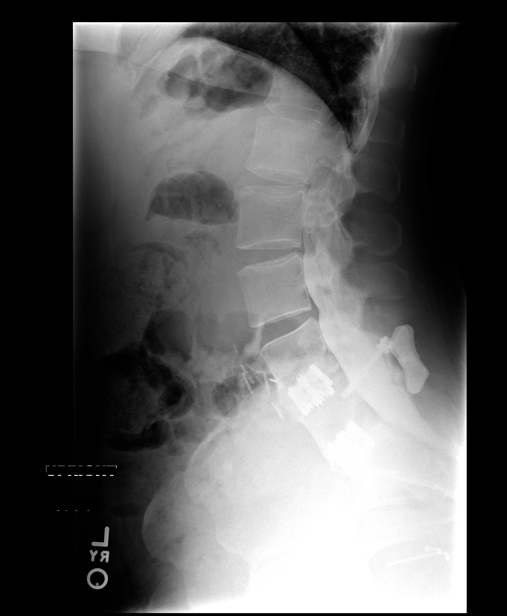

[Series 1004: view not recorded · 0.20mm/px · 1 of 1 slices shown (3 of 3)]
[im 1/1]
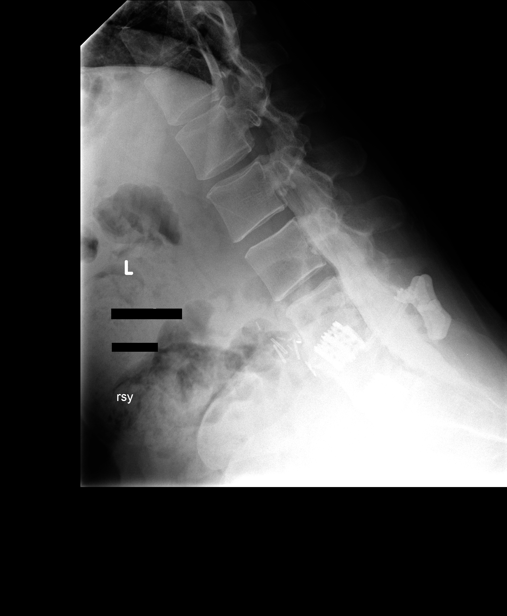

[14 of 19 positions shown; findings below may reference images not displayed]

IMPRESSION: Successful injection of  intrathecal contrast for myelography.

MYELOGRAM LUMBAR
FINDINGS: Good opacification lumbar subarachnoid space.  Previous
L4-L5 and L5-S1 Ray cage fusion, most recently augmented with
posterior instrumentation at L4-L5 using interspinous plate and
transfacet screws.  There is no nerve root cut off or spinal
stenosis.  Standing flexion/extension demonstrates mild annular
bulging at L3-L4 without dynamic instability.

Fluoroscopy Time: 52 seconds
IMPRESSION: As above.

CT MYELOGRAPHY LUMBAR SPINE
FINDINGS: No prevertebral or paraspinous masses.  Surgical clips
are again noted anterior to the L4 vertebral body.

L1-2: Normal.

L2-3: Normal.

L3-4: Mild bulge.  Mild facet arthropathy.

L4-5: Interval pseudarthrosis at L4-5 now appears to be fused
posteriorly across the facets as well as fusing anteriorly across
the interspace. Hardware appropriately placed.  Mild subsidence of
the cages has not progressed. No apparent L4 or L5 nerve root
impingement.  Adequate posterior decompression.

L5-S1: Solid fusion.  No residual neural compression.

Compared with prior CT of lumbar spine from [DATE], the
pseudarthrosis is improved.
IMPRESSION: Interval development of bony bridging across the L4-5 interspace
particularly posteriorly.  Some interbody bridging is not as robust
as seen at L5-S1, but overall I believe the fusion is solid.

Solid fusion L5-S1.

Mild adjacent segment disease L3-L4 with central annular bulging.

No myelographic nerve root cut off, and no evidence for dynamic
instability.

## 2012-06-17 IMAGING — CT CT L SPINE W/ CM
4 of 11 series · 11 of 33 positions shown, 13 images · IV contrast (omnipaque)
Comparison: Most recent CT lumbar spine [DATE].  CT myelogram
[DATE].

MYELOGRAM INJECTION
TECHNIQUE: Informed consent was obtained from the patient prior to
the procedure, including potential complications of headache,
allergy, infection and pain. Specific instructions were given
regarding 24 hour bedrest post procedure to prevent post-LP
headache.  A timeout procedure was performed.  With the patient
prone, the lower back was prepped with Betadine.  1% Lidocaine was
used for local anesthesia.  Lumbar puncture was performed by the
radiologist at the L2-L3 level using a 22 gauge needle with return
of clear CSF.  15 cc of Omnipaque 180 was injected into the
subarachnoid space .

I personally performed the lumbar puncture and administered the
intrathecal contrast. I also personally supervised acquisition of
the myelogram images.
CLINICAL DATA: Low back pain.  Some left leg discomfort.  Previous
lumbar surgeries.
TECHNIQUE: Multidetector CT imaging of the lumbar spine was
performed following myelography.  Multiplanar CT image
reconstructions were also generated.

[Series 2: l spine bone · axial · 0.27mm/px · z∈[+16,+94]mm · 2 of 94 slices shown, 3 images]
[im 32/94  soft-tissue]
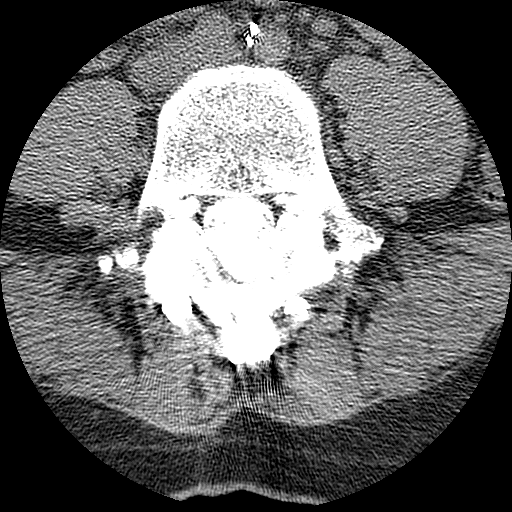
[im 32/94  bone]
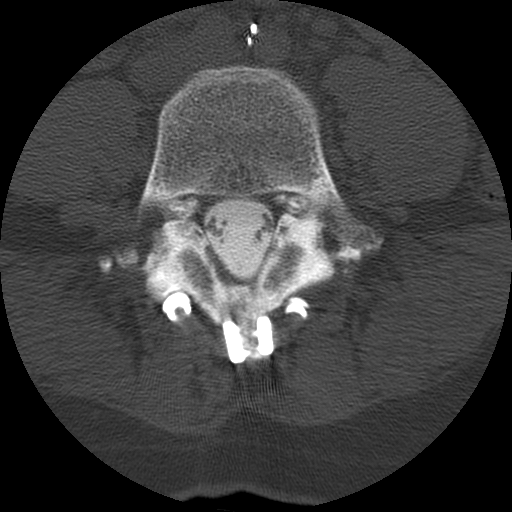
[im 63/94  bone]
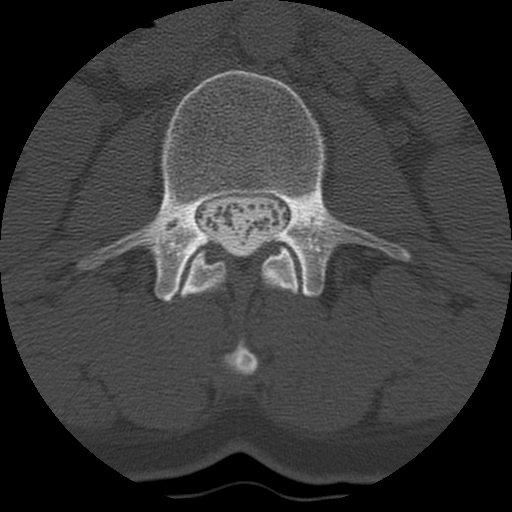

[Series 3: l spine soft · axial · 0.27mm/px · z∈[+16,+94]mm · 2 of 94 slices shown]
[im 32/94  soft-tissue]
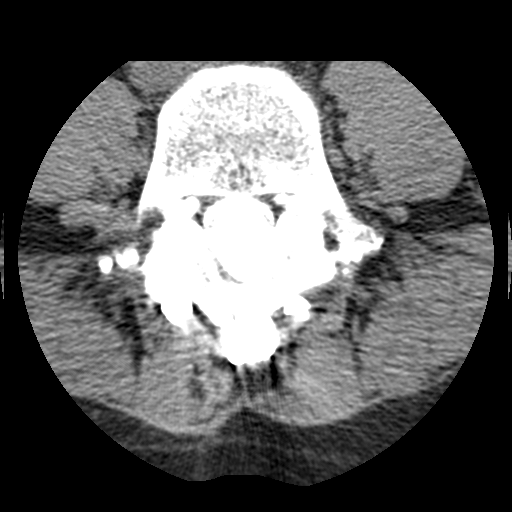
[im 63/94  soft-tissue]
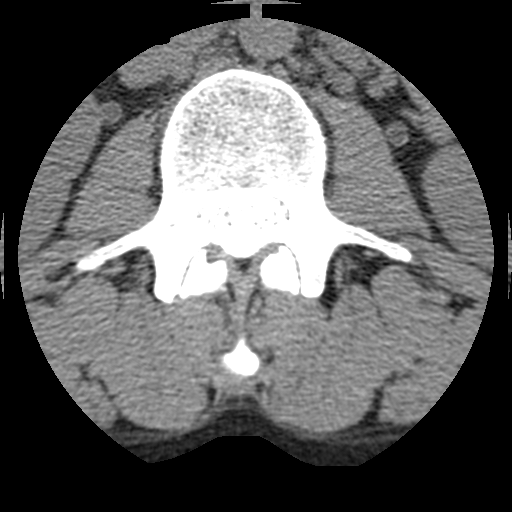

[Series 400: cor · coronal · 0.47mm/px · 2 of 50 slices shown]
[im 17/50  bone]
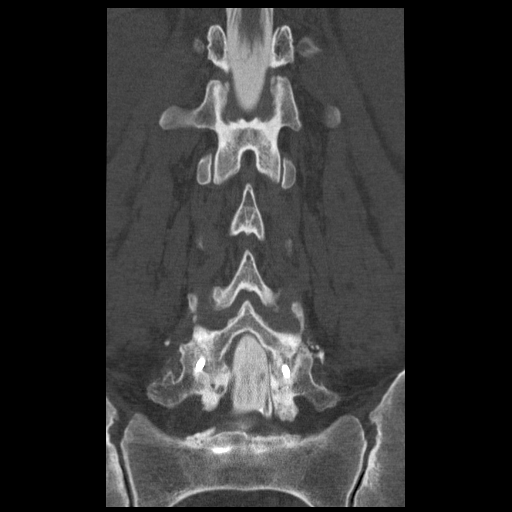
[im 33/50  bone]
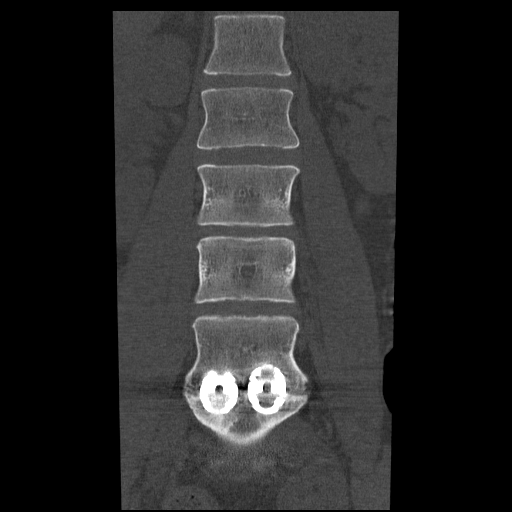

[Series 401: sag · sagittal · 0.47mm/px · 5 of 50 slices shown, 6 images]
[im 17/50  bone]
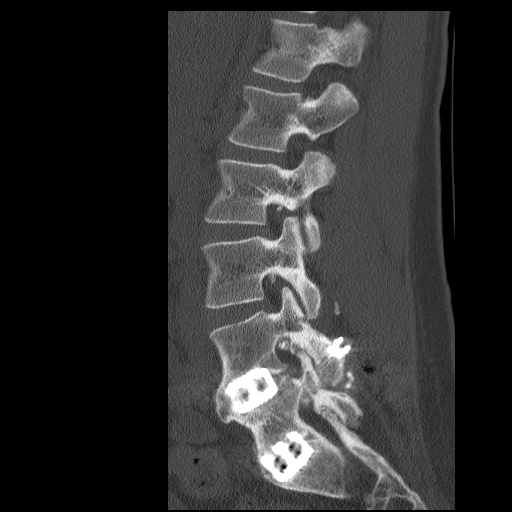
[im 21/50  bone]
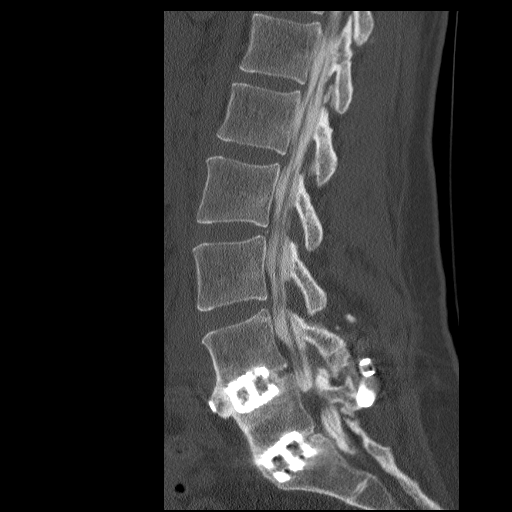
[im 25/50  soft-tissue]
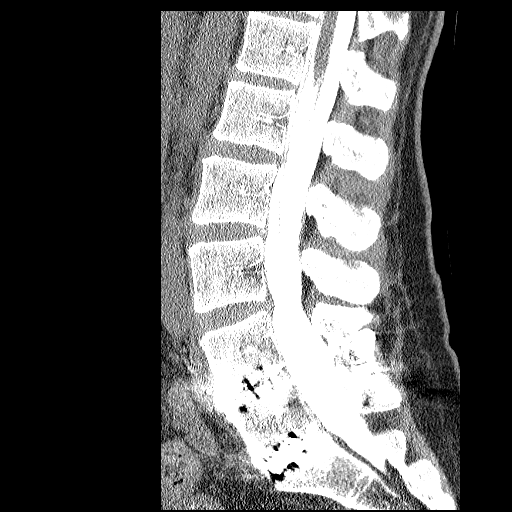
[im 25/50  bone]
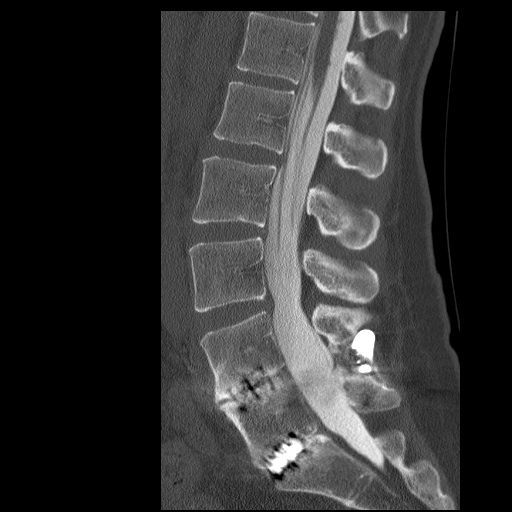
[im 29/50  bone]
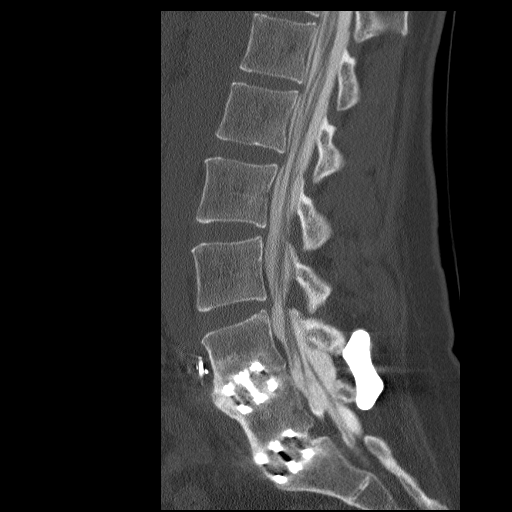
[im 33/50  bone]
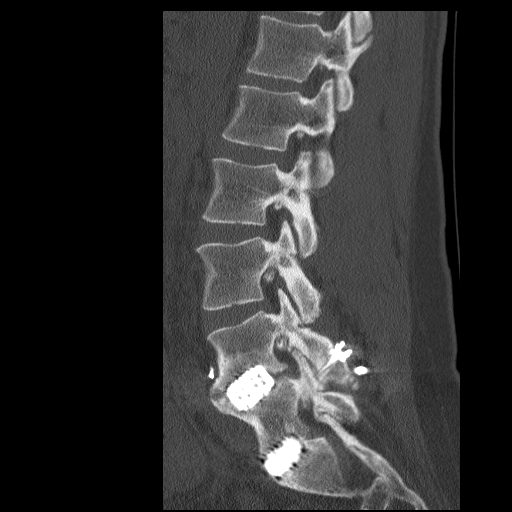

[11 of 33 positions shown; findings below may reference images not displayed]

IMPRESSION: Successful injection of  intrathecal contrast for myelography.

MYELOGRAM LUMBAR
FINDINGS: Good opacification lumbar subarachnoid space.  Previous
L4-L5 and L5-S1 Ray cage fusion, most recently augmented with
posterior instrumentation at L4-L5 using interspinous plate and
transfacet screws.  There is no nerve root cut off or spinal
stenosis.  Standing flexion/extension demonstrates mild annular
bulging at L3-L4 without dynamic instability.

Fluoroscopy Time: 52 seconds
IMPRESSION: As above.

CT MYELOGRAPHY LUMBAR SPINE
FINDINGS: No prevertebral or paraspinous masses.  Surgical clips
are again noted anterior to the L4 vertebral body.

L1-2: Normal.

L2-3: Normal.

L3-4: Mild bulge.  Mild facet arthropathy.

L4-5: Interval pseudarthrosis at L4-5 now appears to be fused
posteriorly across the facets as well as fusing anteriorly across
the interspace. Hardware appropriately placed.  Mild subsidence of
the cages has not progressed. No apparent L4 or L5 nerve root
impingement.  Adequate posterior decompression.

L5-S1: Solid fusion.  No residual neural compression.

Compared with prior CT of lumbar spine from [DATE], the
pseudarthrosis is improved.
IMPRESSION: Interval development of bony bridging across the L4-5 interspace
particularly posteriorly.  Some interbody bridging is not as robust
as seen at L5-S1, but overall I believe the fusion is solid.

Solid fusion L5-S1.

Mild adjacent segment disease L3-L4 with central annular bulging.

No myelographic nerve root cut off, and no evidence for dynamic
instability.

## 2012-06-17 MED ORDER — ONDANSETRON HCL 4 MG/2ML IJ SOLN
4.0000 mg | Freq: Once | INTRAMUSCULAR | Status: AC
  Administered 2012-06-17: 4 mg via INTRAMUSCULAR

## 2012-06-17 MED ORDER — IOHEXOL 180 MG/ML  SOLN
15.0000 mL | Freq: Once | INTRAMUSCULAR | Status: AC | PRN
  Administered 2012-06-17: 15 mL via INTRATHECAL

## 2012-06-17 MED ORDER — HYDROMORPHONE HCL PF 2 MG/ML IJ SOLN
2.0000 mg | Freq: Once | INTRAMUSCULAR | Status: AC
  Administered 2012-06-17: 2 mg via INTRAMUSCULAR

## 2012-06-17 MED ORDER — DIAZEPAM 5 MG PO TABS
10.0000 mg | ORAL_TABLET | Freq: Once | ORAL | Status: AC
  Administered 2012-06-17: 10 mg via ORAL

## 2012-09-09 ENCOUNTER — Other Ambulatory Visit (INDEPENDENT_AMBULATORY_CARE_PROVIDER_SITE_OTHER): Payer: Self-pay

## 2012-09-09 DIAGNOSIS — E042 Nontoxic multinodular goiter: Secondary | ICD-10-CM

## 2012-09-10 ENCOUNTER — Ambulatory Visit
Admission: RE | Admit: 2012-09-10 | Discharge: 2012-09-10 | Disposition: A | Discharge status: Home / Self Care | Payer: BC Managed Care – PPO | Source: Ambulatory Visit | Attending: Surgery | Admitting: Surgery

## 2012-09-10 DIAGNOSIS — E042 Nontoxic multinodular goiter: Secondary | ICD-10-CM

## 2012-09-10 IMAGING — US US SOFT TISSUE HEAD/NECK
1 series · 13 of 25 positions shown · non-contrast
Comparison: [DATE], [DATE], [DATE].

CLINICAL DATA: History of multinodular thyroid goiter with previous
demonstration of bilateral thyroid nodules.  Status post prior left
thyroid biopsy in [W0].

THYROID ULTRASOUND
TECHNIQUE: Ultrasound examination of the thyroid gland and adjacent
soft tissues was performed.

[Series 1: us soft tissue head/neck · 0.08mm/px · 13 of 42 slices shown]
[im 1/42]
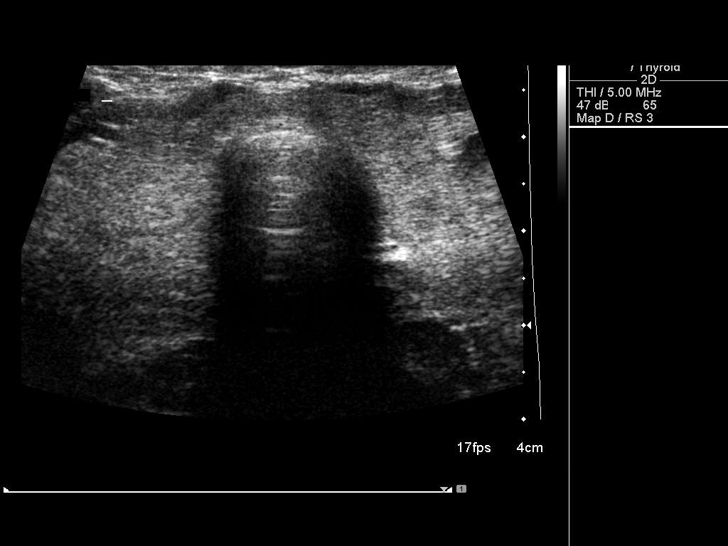
[im 4/42]
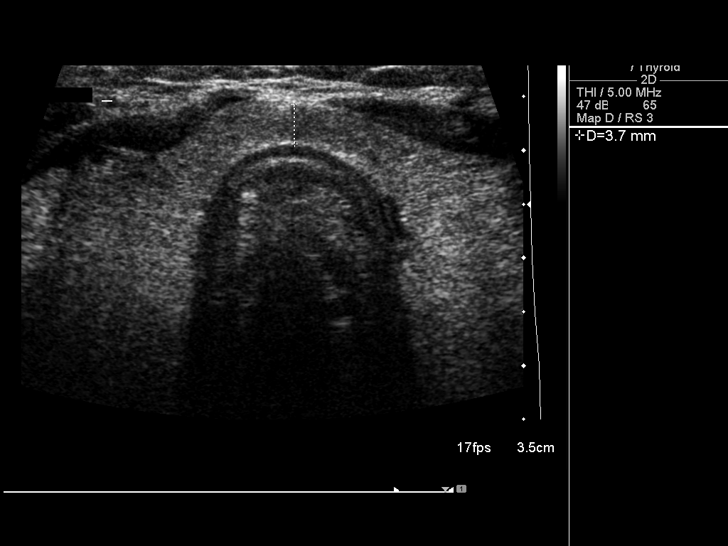
[im 7/42]
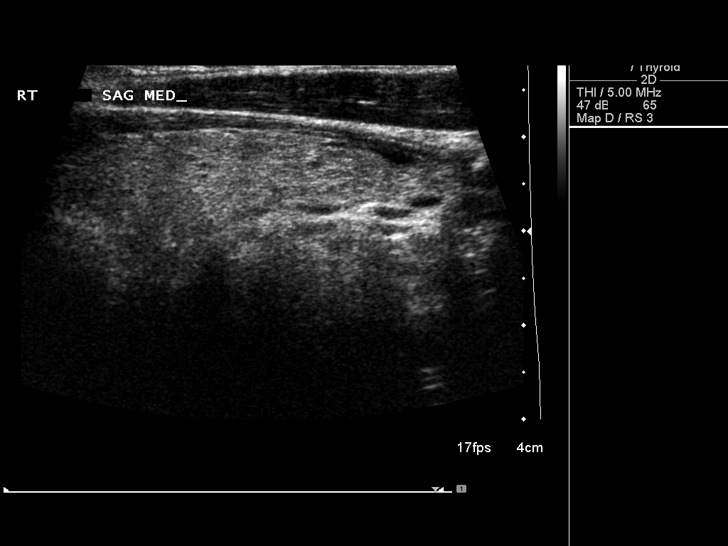
[im 11/42]
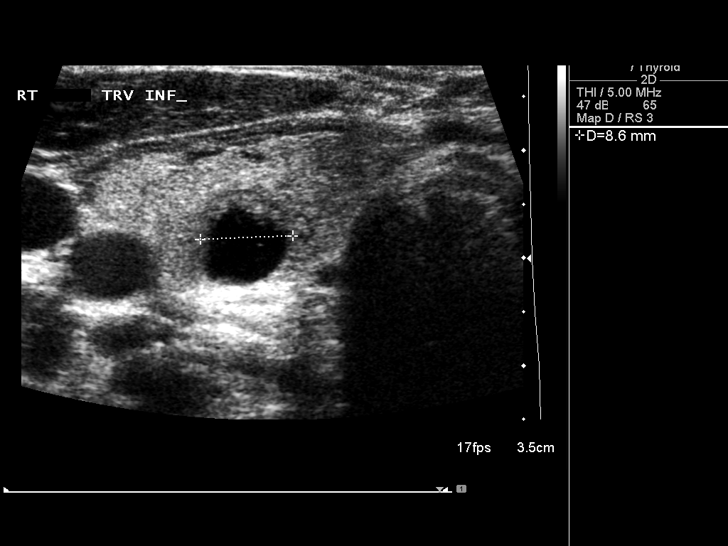
[im 14/42]
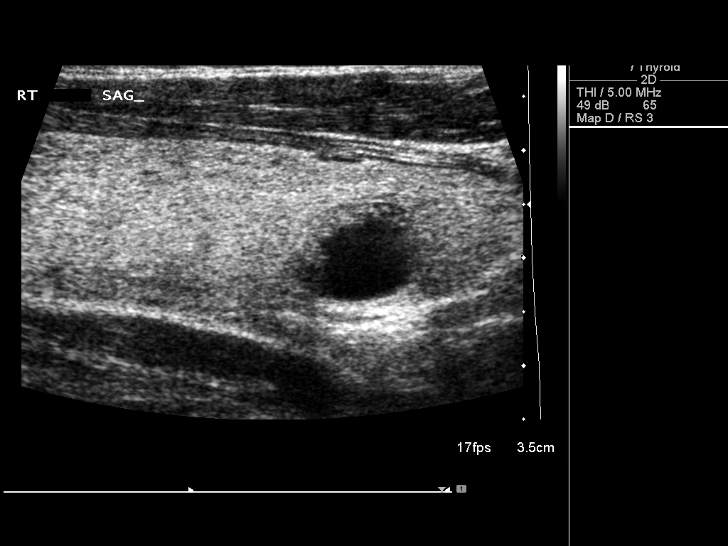
[im 18/42]
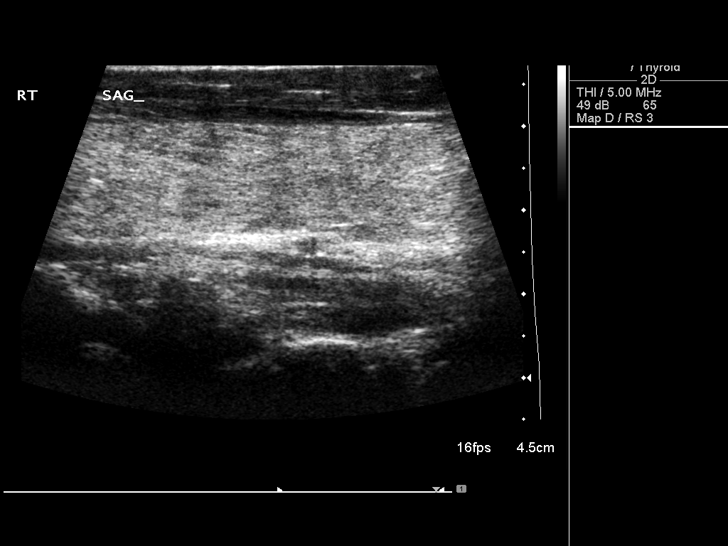
[im 21/42]
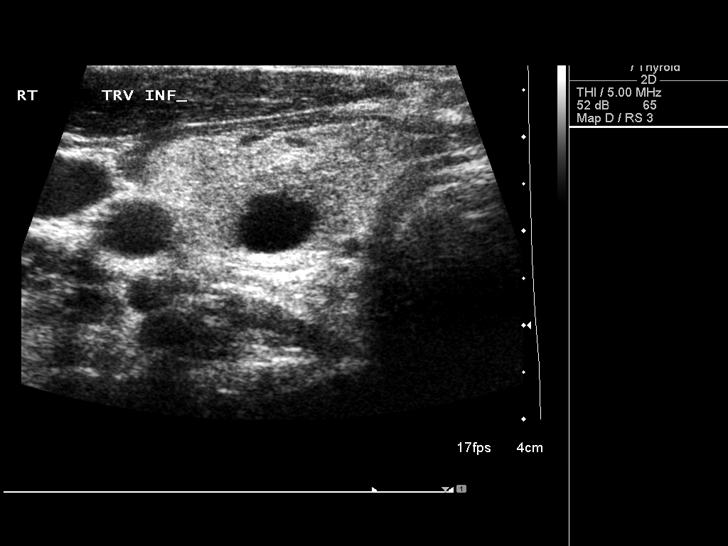
[im 24/42]
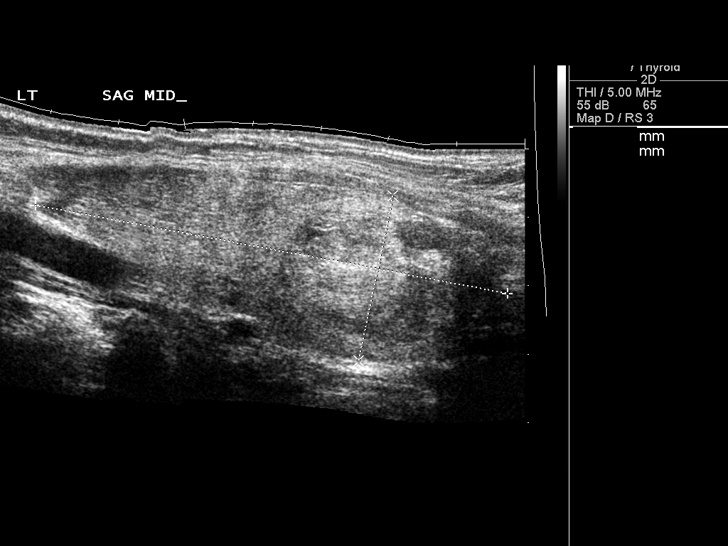
[im 28/42]
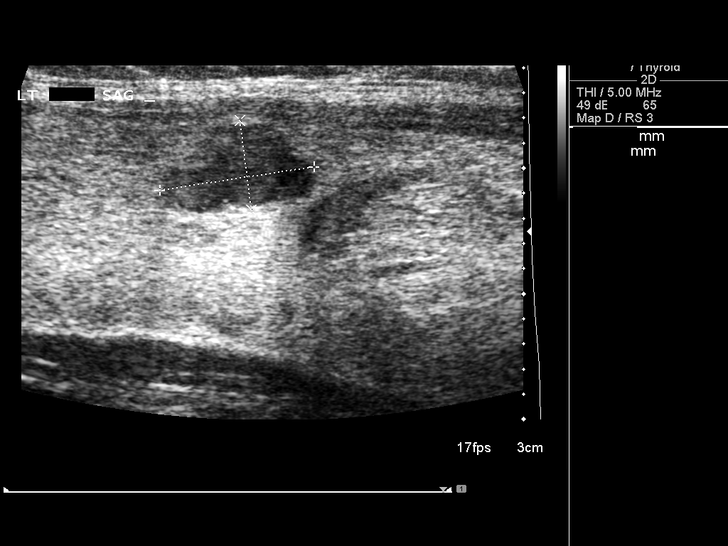
[im 31/42]
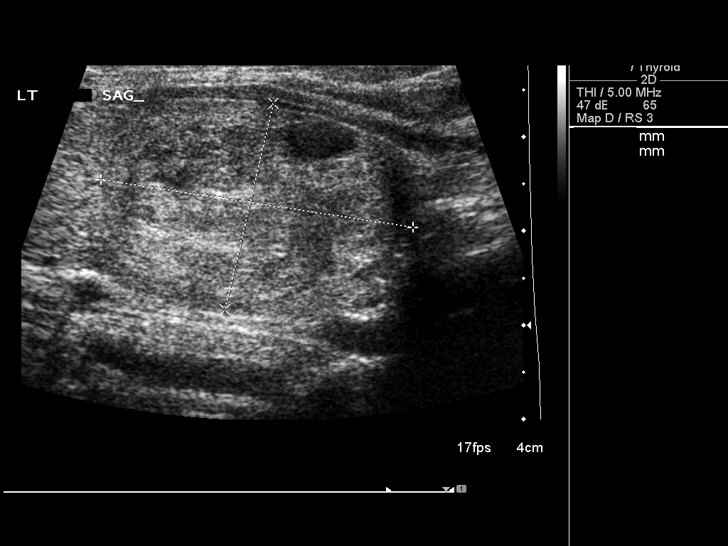
[im 35/42]
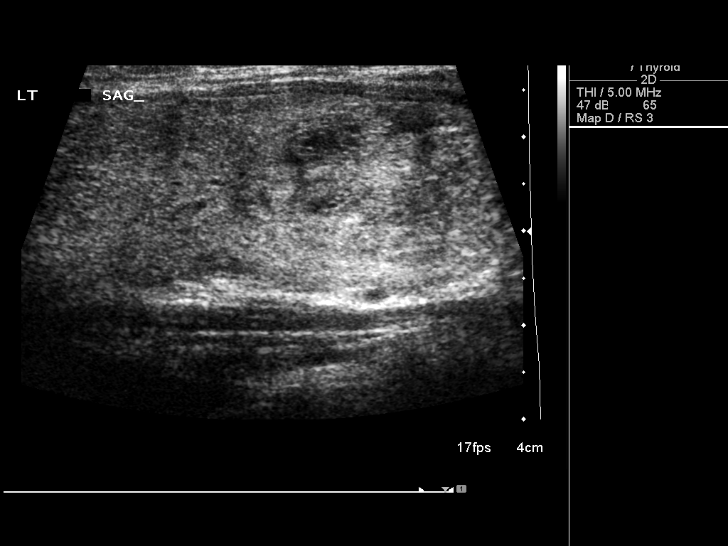
[im 38/42]
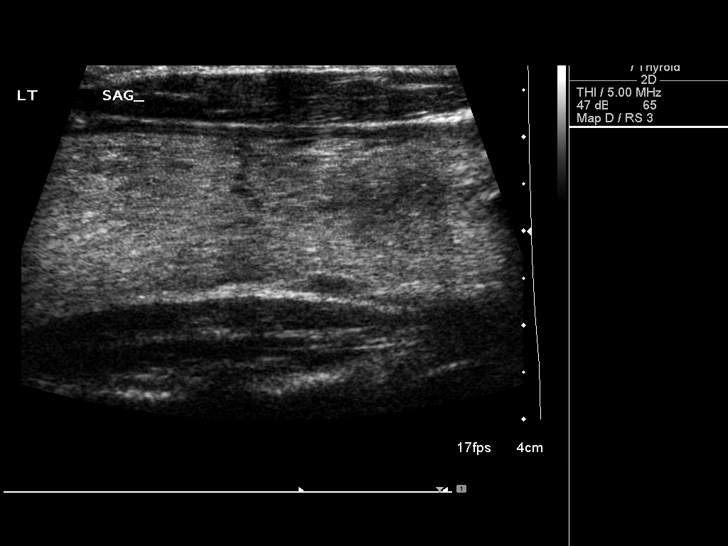
[im 42/42]
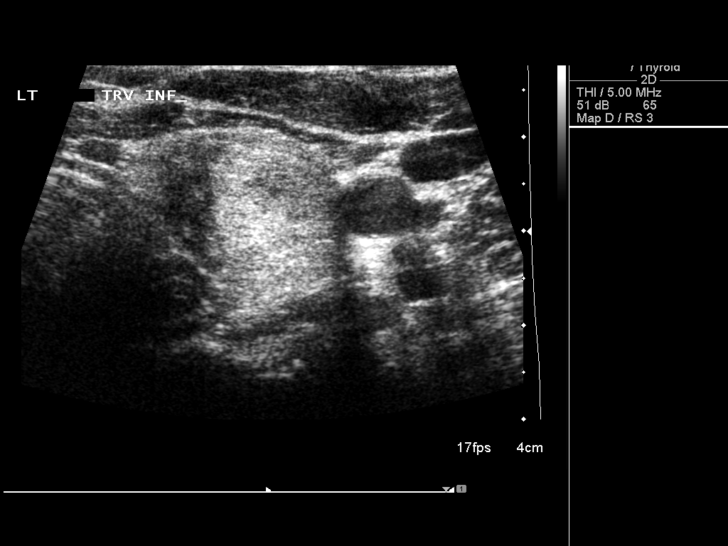

[13 of 25 positions shown; findings below may reference images not displayed]

FINDINGS: Right thyroid lobe:  7.9 x 2.3 x 3.0 cm
Left thyroid lobe:  7.0 x 2.5 x 2.8 cm
Isthmus:  0.4 cm

Focal nodules:  Small, predominately cystic nodule in the inferior
right lobe shows similar size since prior studies with mild
interval irregularity of contour.  Estimated size is 1.1 x 0.8 x
0.9 cm.  The dominant solid nodule in the inferior left lobe is
stable in size compared to the prior study with dimensions of
approximately 3.4 x 2.3 x 2.4 cm.  Smaller hypoechoic nodule in the
left lobe measures approximately 1.2 x 0.7 x 0.8 cm.  This nodule
does show slightly more lobulated contour than on the prior study
but similar size.  This was predominately cystic on previous exams
and more uniform echogenicity now suggests potentially hemorrhage
into the prior nodule.

Lymphadenopathy:  None visualized.
IMPRESSION: Essentially stable multinodular thyroid goiter as above.  The
smaller and previously partially cystic nodule in the left lobe
does show change and internal echogenicity and slightly more
lobulated contour.  This may be due to secondary hemorrhage.
Continues sonographic surveillance is recommended.  The dominant
solid nodule in the left lobe is stable.  The predominately cystic
nodule in the right lobe shows slight change in morphology without
significant increase in size.

## 2012-09-14 ENCOUNTER — Encounter (INDEPENDENT_AMBULATORY_CARE_PROVIDER_SITE_OTHER): Payer: Self-pay | Admitting: Surgery

## 2012-09-14 ENCOUNTER — Ambulatory Visit (INDEPENDENT_AMBULATORY_CARE_PROVIDER_SITE_OTHER): Payer: BC Managed Care – PPO | Admitting: Surgery

## 2012-09-14 VITALS — BP 104/70 | HR 70 | Temp 98.6°F | Resp 16 | Ht 68.0 in | Wt 189.6 lb

## 2012-09-14 DIAGNOSIS — E042 Nontoxic multinodular goiter: Secondary | ICD-10-CM

## 2012-09-14 NOTE — Addendum Note (Signed)
Addended by: Joanette Gula on: 09/14/2012 04:15 PM   Modules accepted: Orders

## 2012-09-14 NOTE — Patient Instructions (Signed)
Goiter  Goiter is an enlarged thyroid gland. The thyroid gland sits at the base of the front of the neck. The gland produces hormones that regulate mood, body temperature, pulse rate, and digestion. Most goiters are painless and are not a cause for serious concern. Goiters and conditions that cause goiters can be treated if necessary.   CAUSES   Common causes of goiter include:   Graves disease (causes too much hormone to be produced [hyperthyroidism]).   Hashimoto's disease (causes too little hormone to be produced [hypothyroidism]).   Thyroiditis (inflammation of the thyroid sometimes caused by virus or pregnancy).   Nodular goiter (small bumps form; sometimes called toxic nodular goiter).   Pregnancy.   Thyroid cancer (very few goiters with nodules are cancerous).   Certain medications.   Radiation exposure.   Iodine deficiency (more common in developing countries in inland populations).  RISK FACTORS  Risk factors for goiter include:   A family history of goiter.   Female gender.   Inadequate iodine in the diet.   Age older than 40 years.  SYMPTOMS   Many goiters do not cause symptoms. When symptoms do occur, they may include:   Swelling in the lower part of the neck. This swelling can range from a very small bump to a large lump.   A tight feeling in the throat.   A hoarse voice.  Less commonly, a goiter may result in:   Coughing.   Wheezing.   Difficulty swallowing.   Difficulty breathing.   Bulging neck veins.   Dizziness.  When a goiter is the result of hyperthyroidism, symptoms may include:   Rapid or irregular heart beat.   Sicknessin your stomach (nausea).   Vomiting.   Diarrhea.   Shaking.   Irritable feeling.   Bulging eyes.   Weight loss.   Heat sensitivity.   Anxiety.  When a goiter is the result of hypothyroidism, symptoms may include:   Tiredness.   Dry skin.   Constipation.   Weight gain.   Irregular menstrual cycle.   Depressed mood.   Sensitivity to cold.   DIAGNOSIS   Tests used to diagnose goiter include:   A physical exam.   Blood tests, including thyroid hormone levels and antibody testing.   Ultrasonography, computerized X-ray scan (computed tomography, CT) or computerized magnetic scan (magnetic resonance imaging, MRI).   Thyroid scan (imaging along with safe radioactive injection).   Tissue sample taken (biopsy) of nodules. This is sometimes done to confirm that the nodules are not cancerous.  TREATMENT   Treatment will depend on the cause of the goiter. Treatment may include:   Monitoring. In some cases, no treatment is necessary, and your doctor will monitor yourcondition at regular check ups.   Medications and supplements. Thyroid medication (thyroid hormone replacement) is available for hyperthroidism and hypothyroidism.   If inflammation is the cause, over-the-counter medication or steroid medication may be recommended.   Goiters caused by iodine deficiency can be treated with iodine supplements or changes in diet.   Radioactive iodine treatment. Radioactive iodine is injected into the blood. It travels to the thyroid gland, kills thyroid cells, and reduces the size of the gland. This is only used when the thyroid gland is overactive. Lifelong thyroid hormone medication is often necessary after this treatment.   Surgery. A procedure to remove all or part of the gland may be recommended in severe cases or when cancer is the cause. Hormones can be taken to replace the hormones normally   produced by the thyroid.  HOME CARE INSTRUCTIONS    Take medications as directed.   Follow your caregiver's recommendations for any dietary changes.   Follow up with your caregiver for further examination and testing, as directed.  PREVENTION    If you have a family history of goiter, discuss screening with your doctor.   Make sure you are getting enough iodine in your diet.   Use of iodized table salt can help prevent iodine deficiency.   Document Released: 03/27/2010 Document Revised: 12/30/2011 Document Reviewed: 03/27/2010  [employer] Patient Information 2013 [employer].

## 2012-09-14 NOTE — Progress Notes (Signed)
General Surgery [facility]  Visit Diagnoses: 1. Multinodular goiter (nontoxic)     HISTORY: Patient is a 36 year old white female well known to my surgical practice. She has been followed for about 10 years with a multinodular thyroid goiter. She has had sequential ultrasound scanning. The dominant nodule in the left lobe underwent percutaneous biopsy in 2004 with benign cytopathology. She has never been on thyroid hormone suppression.  Ultrasound performed 09/10/2012 shows an enlarged thyroid gland with the right lobe measuring 7.9 cm in the left lobe measuring 7.0 cm. Dominant nodule in the inferior left lobe measures 3.4 cm and is stable compared with prior studies. There are or other small nodules present within the gland. In the left lobe is a 1.2 cm nodule which has changed somewhat in morphology. It is not particularly worrisome but there has been a change from prior studies.  PERTINENT REVIEW OF SYSTEMS: Patient denies any compressive symptoms. She denies tremors. She denies palpitations. She denies discomfort.  EXAM: HEENT: normocephalic; pupils equal and reactive; sclerae clear; dentition good; mucous membranes moist NECK:  Fullness in the anterior neck; palpation shows a soft nodular thyroid gland with a dominant nodule in the left inferior pole measuring approximately 3 cm and extending down to the head of the clavicle; symmetric on extension; no palpable anterior or posterior cervical lymphadenopathy; no supraclavicular masses; no tenderness CHEST: clear to auscultation bilaterally without rales, rhonchi, or wheezes CARDIAC: regular rate and rhythm without significant murmur; peripheral pulses are full EXT:  non-tender without edema; no deformity NEURO: no gross focal deficits; no sign of tremor   IMPRESSION: Multinodular thyroid goiter  PLAN: Patient and I discussed these findings. We discussed further observation versus considering surgical resection.  At this point there is no absolute indication for surgical resection of the thyroid gland. Given the fact that there was some change in the morphology of a thyroid nodule in the left lobe, I feel we should repeat her thyroid ultrasound in one year. We will check her TSH level at that time and she will return following the studies for physical examination.  Velora Heckler, MD, [facility] Office: [phone]

## 2012-10-01 ENCOUNTER — Other Ambulatory Visit: Payer: Self-pay | Admitting: Dermatology

## 2013-08-24 ENCOUNTER — Encounter (INDEPENDENT_AMBULATORY_CARE_PROVIDER_SITE_OTHER): Payer: Self-pay | Admitting: Surgery

## 2014-08-10 ENCOUNTER — Other Ambulatory Visit: Payer: Self-pay | Admitting: Obstetrics and Gynecology

## 2014-08-11 LAB — CYTOLOGY - PAP

## 2015-12-15 ENCOUNTER — Telehealth: Payer: Self-pay | Admitting: Genetic Counselor

## 2015-12-15 NOTE — Telephone Encounter (Signed)
UNABLE TO REACH PT WITH NUMBER PROVIDED. CONTACTED REFERRING OFFICE AND RECEIVED AN ALTERNATE NUMBER FOR PT. UPDATED THE NUMBER IN EPIC. PT DECLINE SERVICE AT THIS TIME

## 2016-01-23 DIAGNOSIS — M461 Sacroiliitis, not elsewhere classified: Secondary | ICD-10-CM | POA: Diagnosis not present

## 2016-03-15 DIAGNOSIS — M461 Sacroiliitis, not elsewhere classified: Secondary | ICD-10-CM | POA: Diagnosis not present

## 2016-03-15 DIAGNOSIS — G894 Chronic pain syndrome: Secondary | ICD-10-CM | POA: Diagnosis not present

## 2016-03-15 DIAGNOSIS — Z79891 Long term (current) use of opiate analgesic: Secondary | ICD-10-CM | POA: Diagnosis not present

## 2016-03-15 DIAGNOSIS — M47817 Spondylosis without myelopathy or radiculopathy, lumbosacral region: Secondary | ICD-10-CM | POA: Diagnosis not present

## 2016-03-15 DIAGNOSIS — M62830 Muscle spasm of back: Secondary | ICD-10-CM | POA: Diagnosis not present

## 2016-05-08 DIAGNOSIS — M62830 Muscle spasm of back: Secondary | ICD-10-CM | POA: Diagnosis not present

## 2016-05-08 DIAGNOSIS — Z79891 Long term (current) use of opiate analgesic: Secondary | ICD-10-CM | POA: Diagnosis not present

## 2016-05-08 DIAGNOSIS — G894 Chronic pain syndrome: Secondary | ICD-10-CM | POA: Diagnosis not present

## 2016-05-08 DIAGNOSIS — M461 Sacroiliitis, not elsewhere classified: Secondary | ICD-10-CM | POA: Diagnosis not present

## 2016-05-08 DIAGNOSIS — M47817 Spondylosis without myelopathy or radiculopathy, lumbosacral region: Secondary | ICD-10-CM | POA: Diagnosis not present

## 2016-06-05 DIAGNOSIS — G894 Chronic pain syndrome: Secondary | ICD-10-CM | POA: Diagnosis not present

## 2016-06-05 DIAGNOSIS — M62830 Muscle spasm of back: Secondary | ICD-10-CM | POA: Diagnosis not present

## 2016-06-05 DIAGNOSIS — M461 Sacroiliitis, not elsewhere classified: Secondary | ICD-10-CM | POA: Diagnosis not present

## 2016-06-05 DIAGNOSIS — M47817 Spondylosis without myelopathy or radiculopathy, lumbosacral region: Secondary | ICD-10-CM | POA: Diagnosis not present

## 2016-06-11 DIAGNOSIS — M461 Sacroiliitis, not elsewhere classified: Secondary | ICD-10-CM | POA: Diagnosis not present

## 2016-07-03 DIAGNOSIS — M461 Sacroiliitis, not elsewhere classified: Secondary | ICD-10-CM | POA: Diagnosis not present

## 2016-07-03 DIAGNOSIS — M62830 Muscle spasm of back: Secondary | ICD-10-CM | POA: Diagnosis not present

## 2016-07-03 DIAGNOSIS — G894 Chronic pain syndrome: Secondary | ICD-10-CM | POA: Diagnosis not present

## 2016-07-03 DIAGNOSIS — M47817 Spondylosis without myelopathy or radiculopathy, lumbosacral region: Secondary | ICD-10-CM | POA: Diagnosis not present

## 2016-07-09 DIAGNOSIS — M47817 Spondylosis without myelopathy or radiculopathy, lumbosacral region: Secondary | ICD-10-CM | POA: Diagnosis not present

## 2016-08-06 DIAGNOSIS — M461 Sacroiliitis, not elsewhere classified: Secondary | ICD-10-CM | POA: Diagnosis not present

## 2016-08-06 DIAGNOSIS — M47817 Spondylosis without myelopathy or radiculopathy, lumbosacral region: Secondary | ICD-10-CM | POA: Diagnosis not present

## 2016-08-06 DIAGNOSIS — G894 Chronic pain syndrome: Secondary | ICD-10-CM | POA: Diagnosis not present

## 2016-08-06 DIAGNOSIS — M62830 Muscle spasm of back: Secondary | ICD-10-CM | POA: Diagnosis not present

## 2016-09-03 DIAGNOSIS — G894 Chronic pain syndrome: Secondary | ICD-10-CM | POA: Diagnosis not present

## 2016-09-03 DIAGNOSIS — M47817 Spondylosis without myelopathy or radiculopathy, lumbosacral region: Secondary | ICD-10-CM | POA: Diagnosis not present

## 2016-09-03 DIAGNOSIS — M62830 Muscle spasm of back: Secondary | ICD-10-CM | POA: Diagnosis not present

## 2016-09-03 DIAGNOSIS — M461 Sacroiliitis, not elsewhere classified: Secondary | ICD-10-CM | POA: Diagnosis not present

## 2016-10-28 DIAGNOSIS — M62830 Muscle spasm of back: Secondary | ICD-10-CM | POA: Diagnosis not present

## 2016-10-28 DIAGNOSIS — M461 Sacroiliitis, not elsewhere classified: Secondary | ICD-10-CM | POA: Diagnosis not present

## 2016-10-28 DIAGNOSIS — M47817 Spondylosis without myelopathy or radiculopathy, lumbosacral region: Secondary | ICD-10-CM | POA: Diagnosis not present

## 2016-10-28 DIAGNOSIS — G894 Chronic pain syndrome: Secondary | ICD-10-CM | POA: Diagnosis not present

## 2016-12-27 DIAGNOSIS — M461 Sacroiliitis, not elsewhere classified: Secondary | ICD-10-CM | POA: Diagnosis not present

## 2016-12-27 DIAGNOSIS — M62830 Muscle spasm of back: Secondary | ICD-10-CM | POA: Diagnosis not present

## 2016-12-27 DIAGNOSIS — G894 Chronic pain syndrome: Secondary | ICD-10-CM | POA: Diagnosis not present

## 2016-12-27 DIAGNOSIS — M47817 Spondylosis without myelopathy or radiculopathy, lumbosacral region: Secondary | ICD-10-CM | POA: Diagnosis not present

## 2017-01-24 DIAGNOSIS — M47817 Spondylosis without myelopathy or radiculopathy, lumbosacral region: Secondary | ICD-10-CM | POA: Diagnosis not present

## 2017-01-24 DIAGNOSIS — M461 Sacroiliitis, not elsewhere classified: Secondary | ICD-10-CM | POA: Diagnosis not present

## 2017-01-24 DIAGNOSIS — M62830 Muscle spasm of back: Secondary | ICD-10-CM | POA: Diagnosis not present

## 2017-01-24 DIAGNOSIS — G894 Chronic pain syndrome: Secondary | ICD-10-CM | POA: Diagnosis not present

## 2017-02-25 DIAGNOSIS — G894 Chronic pain syndrome: Secondary | ICD-10-CM | POA: Diagnosis not present

## 2017-02-25 DIAGNOSIS — M461 Sacroiliitis, not elsewhere classified: Secondary | ICD-10-CM | POA: Diagnosis not present

## 2017-02-25 DIAGNOSIS — M62830 Muscle spasm of back: Secondary | ICD-10-CM | POA: Diagnosis not present

## 2017-02-25 DIAGNOSIS — M47817 Spondylosis without myelopathy or radiculopathy, lumbosacral region: Secondary | ICD-10-CM | POA: Diagnosis not present

## 2017-03-11 ENCOUNTER — Ambulatory Visit
Admission: EM | Admit: 2017-03-11 | Discharge: 2017-03-11 | Disposition: A | Discharge status: Home / Self Care | Payer: BLUE CROSS/BLUE SHIELD | Attending: Family Medicine | Admitting: Family Medicine

## 2017-03-11 ENCOUNTER — Encounter: Payer: Self-pay | Admitting: Emergency Medicine

## 2017-03-11 DIAGNOSIS — N3001 Acute cystitis with hematuria: Secondary | ICD-10-CM

## 2017-03-11 LAB — URINALYSIS, COMPLETE (UACMP) WITH MICROSCOPIC
BACTERIA UA: NONE SEEN
Bilirubin Urine: NEGATIVE
GLUCOSE, UA: NEGATIVE mg/dL
Ketones, ur: NEGATIVE mg/dL
Leukocytes, UA: NEGATIVE
Nitrite: NEGATIVE
PH: 7 (ref 5.0–8.0)
Protein, ur: NEGATIVE mg/dL
Specific Gravity, Urine: 1.015 (ref 1.005–1.030)

## 2017-03-11 MED ORDER — FOSFOMYCIN TROMETHAMINE 3 G PO PACK: 3.0000 g | PACK | Freq: Once | ORAL | 0 refills | Status: AC

## 2017-03-11 MED ORDER — FLUCONAZOLE 150 MG PO TABS: 150.0000 mg | ORAL_TABLET | Freq: Once | ORAL | 0 refills | Status: AC

## 2017-03-11 NOTE — ED Triage Notes (Signed)
Patient c/o burning when urinating and increase in frequency for the past 2 days.

## 2017-03-11 NOTE — ED Provider Notes (Signed)
[facility]    CSN: [id number] Arrival date & time: 03/11/17  1651     History   Chief Complaint Chief Complaint  Patient presents with  . Dysuria    HPI Kristy Mendez is a 41 y.o. female.   Patient is a 41 year old white female symptoms of a UTI. She states that she normally doesn't get UTIs after learning certain things to do to keep from having irritation and discomfort such as going to the bathroom before sex using lubricant during sexual relations and avoiding hot tub without bathing suit. She states that she recently didn't follow one of her hygienic rules and has a UTI now. She never smoked no known drug allergies and she has a history of thyroid disease. He has had lumbar disc surgery of lumbar fusion. She has sisters had Hodgkin's lymphoma in 1 para of colon cancer.   The history is provided by the patient. No language interpreter was used.  Dysuria  Pain quality:  Sharp and stabbing Pain severity:  Moderate Onset quality:  Sudden Timing:  Constant Progression:  Worsening Chronicity:  New Recent urinary tract infections: no   Relieved by:  Nothing Worsened by:  Nothing Ineffective treatments:  None tried Urinary symptoms: frequent urination   Associated symptoms: no abdominal pain, no fever, no flank pain, no genital lesions, no nausea and no vaginal discharge   Risk factors: sexually active     Past Medical History:  Diagnosis Date  . Arthritis   . Blood transfusion without reported diagnosis     Patient Active Problem List   Diagnosis Date Noted  . Multinodular goiter (nontoxic) 09/14/2012    Past Surgical History:  Procedure Laterality Date  . [place] SURGERY  1998  . LUMBAR FUSION  2002, 2011    OB History    No data available       Home Medications    Prior to Admission medications   Medication Sig Start Date End Date Taking? Authorizing Provider  levonorgestrel (MIRENA) 20 MCG/24HR IUD 1 each by Intrauterine route  once.   Yes [provider]  B Complex-C (SUPER B COMPLEX PO) Take by mouth.    [provider]  Boswellia-Glucosamine-Vit D (GLUCOSAMINE COMPLEX PO) Take by mouth.    [provider]  Cyanocobalamin (B-12 PO) Take by mouth.    [provider]  cyclobenzaprine (FLEXERIL) 10 MG tablet  07/07/12   [provider]  fish oil-omega-3 fatty acids 1000 MG capsule Take 2 g by mouth daily.    [provider]  fluconazole (DIFLUCAN) 150 MG tablet Take 1 tablet (150 mg total) by mouth once. 03/11/17 03/11/17  Frederich Cha, MD  fosfomycin (MONUROL) 3 g PACK Take 3 g by mouth once. 03/11/17 03/11/17  Frederich Cha, MD  OxyCODONE HCl, Abuse Deter, 5 MG TABA Take 5 mg by mouth 2 (two) times daily.    [provider]  Vitamin E (VITA-PLUS E PO) Take by mouth.    [provider]    Family History Family History  Problem Relation Age of Onset  . Cancer Sister        Hodgkins Disease  . Cancer Paternal Grandmother        Colon Cancer    Social History Social History  Substance Use Topics  . Smoking status: Never Smoker  . Smokeless tobacco: Never Used  . Alcohol use Yes     Allergies   Patient has no known allergies.   Review of  Systems Review of Systems  Constitutional: Negative for fever.  Gastrointestinal: Negative for abdominal pain and nausea.  Genitourinary: Positive for dysuria. Negative for flank pain and vaginal discharge.  All other systems reviewed and are negative.    Physical Exam Triage Vital Signs ED Triage Vitals  Enc Vitals Group     BP 03/11/17 1800 109/63     Pulse Rate 03/11/17 1800 72     Resp 03/11/17 1800 16     Temp 03/11/17 1800 98.1 F (36.7 C)     Temp Source 03/11/17 1800 Oral     SpO2 03/11/17 1800 100 %     Weight 03/11/17 1757 147 lb (66.7 kg)     Height 03/11/17 1757 5' 8"  (1.727 m)     Head Circumference --      Peak Flow --      Pain Score 03/11/17 1757 0     Pain Loc --       Pain Edu? --      Excl. in [place]? --    No data found.   Updated Vital Signs BP 109/63 (BP Location: Right Arm)   Pulse 72   Temp 98.1 F (36.7 C) (Oral)   Resp 16   Ht 5' 8"  (1.727 m)   Wt 147 lb (66.7 kg)   SpO2 100%   BMI 22.35 kg/m   Visual Acuity Right Eye Distance:   Left Eye Distance:   Bilateral Distance:    Right Eye Near:   Left Eye Near:    Bilateral Near:     Physical Exam  Constitutional: She is oriented to person, place, and time. She appears well-developed and well-nourished.  HENT:  Head: Normocephalic and atraumatic.  Right Ear: External ear normal.  Eyes: Pupils are equal, round, and reactive to light.  Neck: Normal range of motion.  Pulmonary/Chest: Effort normal.  Abdominal: Soft. Bowel sounds are normal. She exhibits no distension.  Musculoskeletal: Normal range of motion. She exhibits no tenderness.  Neurological: She is alert and oriented to person, place, and time.  Skin: Skin is warm.  Psychiatric: She has a normal mood and affect.  Vitals reviewed.    UC Treatments / Results  Labs (all labs ordered are listed, but only abnormal results are displayed) Labs Reviewed  URINALYSIS, COMPLETE (UACMP) WITH MICROSCOPIC - Abnormal; Notable for the following:       Result Value   Hgb urine dipstick SMALL (*)    Squamous Epithelial / LPF 0-5 (*)    All other components within normal limits  URINE CULTURE    EKG  EKG Interpretation None       Radiology No results found.  Procedures Procedures (including critical care time)  Medications Ordered in UC Medications - No data to display Results for orders placed or performed during the hospital encounter of 03/11/17  Urinalysis, Complete w Microscopic  Result Value Ref Range   Color, Urine YELLOW YELLOW   APPearance CLEAR CLEAR   Specific Gravity, Urine 1.015 1.005 - 1.030   pH 7.0 5.0 - 8.0   Glucose, UA NEGATIVE NEGATIVE mg/dL   Hgb urine dipstick SMALL (A) NEGATIVE   Bilirubin  Urine NEGATIVE NEGATIVE   Ketones, ur NEGATIVE NEGATIVE mg/dL   Protein, ur NEGATIVE NEGATIVE mg/dL   Nitrite NEGATIVE NEGATIVE   Leukocytes, UA NEGATIVE NEGATIVE   Squamous Epithelial / LPF 0-5 (A) NONE SEEN   WBC, UA 6-30 0 - 5 WBC/hpf   RBC / HPF 6-30 0 - 5  RBC/hpf   Bacteria, UA NONE SEEN NONE SEEN    Initial Impression / Assessment and Plan / UC Course  I have reviewed the triage vital signs and the nursing notes.  Pertinent labs & imaging results that were available during my care of the patient were reviewed by me and considered in my medical decision making (see chart for details).   [place] patient's planning to travel to [place] this weekend she does not want a prolonged antibiotic that she will interfere with her getting sun. She declined Pyridium. We'll place her on Monuril one time dosage with Diflucan to use in case she develops a yeast infection follow-up with her PCP as needed.  Final Clinical Impressions(s) / UC Diagnoses   Final diagnoses:  Acute cystitis with hematuria    New Prescriptions New Prescriptions   FLUCONAZOLE (DIFLUCAN) 150 MG TABLET    Take 1 tablet (150 mg total) by mouth once.   FOSFOMYCIN (MONUROL) 3 G PACK    Take 3 g by mouth once.    Note: This dictation was prepared with Dragon dictation along with smaller phrase technology. Any transcriptional errors that result from this process are unintentional.   Frederich Cha, MD 03/11/17 1931

## 2017-03-14 LAB — URINE CULTURE
Culture: 20000 — AB
Special Requests: NORMAL

## 2017-03-25 DIAGNOSIS — G894 Chronic pain syndrome: Secondary | ICD-10-CM | POA: Diagnosis not present

## 2017-03-25 DIAGNOSIS — M461 Sacroiliitis, not elsewhere classified: Secondary | ICD-10-CM | POA: Diagnosis not present

## 2017-03-25 DIAGNOSIS — M62830 Muscle spasm of back: Secondary | ICD-10-CM | POA: Diagnosis not present

## 2017-03-25 DIAGNOSIS — M47817 Spondylosis without myelopathy or radiculopathy, lumbosacral region: Secondary | ICD-10-CM | POA: Diagnosis not present

## 2017-05-01 DIAGNOSIS — M461 Sacroiliitis, not elsewhere classified: Secondary | ICD-10-CM | POA: Diagnosis not present

## 2017-05-01 DIAGNOSIS — G894 Chronic pain syndrome: Secondary | ICD-10-CM | POA: Diagnosis not present

## 2017-05-01 DIAGNOSIS — M47817 Spondylosis without myelopathy or radiculopathy, lumbosacral region: Secondary | ICD-10-CM | POA: Diagnosis not present

## 2017-05-01 DIAGNOSIS — M62830 Muscle spasm of back: Secondary | ICD-10-CM | POA: Diagnosis not present

## 2017-05-14 DIAGNOSIS — Z1329 Encounter for screening for other suspected endocrine disorder: Secondary | ICD-10-CM | POA: Diagnosis not present

## 2017-05-14 DIAGNOSIS — Z130 Encounter for screening for diseases of the blood and blood-forming organs and certain disorders involving the immune mechanism: Secondary | ICD-10-CM | POA: Diagnosis not present

## 2017-05-14 DIAGNOSIS — Z01419 Encounter for gynecological examination (general) (routine) without abnormal findings: Secondary | ICD-10-CM | POA: Diagnosis not present

## 2017-05-14 DIAGNOSIS — Z0000 Encounter for general adult medical examination without abnormal findings: Secondary | ICD-10-CM | POA: Diagnosis not present

## 2017-05-14 DIAGNOSIS — Z1231 Encounter for screening mammogram for malignant neoplasm of breast: Secondary | ICD-10-CM | POA: Diagnosis not present

## 2017-05-14 DIAGNOSIS — Z6823 Body mass index (BMI) 23.0-23.9, adult: Secondary | ICD-10-CM | POA: Diagnosis not present

## 2017-05-14 DIAGNOSIS — Z1151 Encounter for screening for human papillomavirus (HPV): Secondary | ICD-10-CM | POA: Diagnosis not present

## 2017-05-14 DIAGNOSIS — Z131 Encounter for screening for diabetes mellitus: Secondary | ICD-10-CM | POA: Diagnosis not present

## 2017-05-20 DIAGNOSIS — M4316 Spondylolisthesis, lumbar region: Secondary | ICD-10-CM | POA: Diagnosis not present

## 2017-05-25 DIAGNOSIS — M4316 Spondylolisthesis, lumbar region: Secondary | ICD-10-CM | POA: Diagnosis not present

## 2017-05-30 DIAGNOSIS — M461 Sacroiliitis, not elsewhere classified: Secondary | ICD-10-CM | POA: Diagnosis not present

## 2017-05-30 DIAGNOSIS — M47817 Spondylosis without myelopathy or radiculopathy, lumbosacral region: Secondary | ICD-10-CM | POA: Diagnosis not present

## 2017-05-30 DIAGNOSIS — M62830 Muscle spasm of back: Secondary | ICD-10-CM | POA: Diagnosis not present

## 2017-05-30 DIAGNOSIS — G894 Chronic pain syndrome: Secondary | ICD-10-CM | POA: Diagnosis not present

## 2017-06-05 ENCOUNTER — Other Ambulatory Visit: Payer: Self-pay | Admitting: Obstetrics and Gynecology

## 2017-06-05 DIAGNOSIS — Z803 Family history of malignant neoplasm of breast: Secondary | ICD-10-CM

## 2017-06-19 ENCOUNTER — Ambulatory Visit
Admission: RE | Admit: 2017-06-19 | Discharge: 2017-06-19 | Disposition: A | Discharge status: Home / Self Care | Payer: BLUE CROSS/BLUE SHIELD | Source: Ambulatory Visit | Attending: Obstetrics and Gynecology | Admitting: Obstetrics and Gynecology

## 2017-06-19 ENCOUNTER — Other Ambulatory Visit: Payer: BLUE CROSS/BLUE SHIELD

## 2017-06-19 DIAGNOSIS — Z803 Family history of malignant neoplasm of breast: Secondary | ICD-10-CM

## 2017-06-19 DIAGNOSIS — N644 Mastodynia: Secondary | ICD-10-CM | POA: Diagnosis not present

## 2017-06-19 MED ORDER — GADOBENATE DIMEGLUMINE 529 MG/ML IV SOLN
14.0000 mL | Freq: Once | INTRAVENOUS | Status: AC | PRN
  Administered 2017-06-19: 14 mL via INTRAVENOUS

## 2017-06-22 DIAGNOSIS — R7989 Other specified abnormal findings of blood chemistry: Secondary | ICD-10-CM | POA: Diagnosis not present

## 2017-06-22 DIAGNOSIS — J9600 Acute respiratory failure, unspecified whether with hypoxia or hypercapnia: Secondary | ICD-10-CM | POA: Diagnosis not present

## 2017-06-22 DIAGNOSIS — R4589 Other symptoms and signs involving emotional state: Secondary | ICD-10-CM | POA: Diagnosis not present

## 2017-06-23 DIAGNOSIS — T402X1A Poisoning by other opioids, accidental (unintentional), initial encounter: Secondary | ICD-10-CM | POA: Diagnosis not present

## 2017-06-23 DIAGNOSIS — Y907 Blood alcohol level of 200-239 mg/100 ml: Secondary | ICD-10-CM | POA: Diagnosis not present

## 2017-06-23 DIAGNOSIS — Z23 Encounter for immunization: Secondary | ICD-10-CM | POA: Diagnosis not present

## 2017-06-23 DIAGNOSIS — G9341 Metabolic encephalopathy: Secondary | ICD-10-CM | POA: Diagnosis not present

## 2017-06-23 DIAGNOSIS — J9602 Acute respiratory failure with hypercapnia: Secondary | ICD-10-CM | POA: Diagnosis not present

## 2017-06-23 DIAGNOSIS — R7989 Other specified abnormal findings of blood chemistry: Secondary | ICD-10-CM | POA: Diagnosis not present

## 2017-06-23 DIAGNOSIS — D72829 Elevated white blood cell count, unspecified: Secondary | ICD-10-CM | POA: Diagnosis not present

## 2017-06-23 DIAGNOSIS — J9600 Acute respiratory failure, unspecified whether with hypoxia or hypercapnia: Secondary | ICD-10-CM | POA: Diagnosis not present

## 2017-06-23 DIAGNOSIS — R4182 Altered mental status, unspecified: Secondary | ICD-10-CM | POA: Diagnosis not present

## 2017-06-23 DIAGNOSIS — F10229 Alcohol dependence with intoxication, unspecified: Secondary | ICD-10-CM | POA: Diagnosis not present

## 2017-06-23 DIAGNOSIS — F17210 Nicotine dependence, cigarettes, uncomplicated: Secondary | ICD-10-CM | POA: Diagnosis not present

## 2017-06-26 ENCOUNTER — Other Ambulatory Visit: Payer: Self-pay | Admitting: Obstetrics and Gynecology

## 2017-06-26 DIAGNOSIS — N63 Unspecified lump in unspecified breast: Secondary | ICD-10-CM

## 2017-06-26 DIAGNOSIS — Z803 Family history of malignant neoplasm of breast: Secondary | ICD-10-CM

## 2017-06-30 DIAGNOSIS — M461 Sacroiliitis, not elsewhere classified: Secondary | ICD-10-CM | POA: Diagnosis not present

## 2017-06-30 DIAGNOSIS — M62830 Muscle spasm of back: Secondary | ICD-10-CM | POA: Diagnosis not present

## 2017-06-30 DIAGNOSIS — G894 Chronic pain syndrome: Secondary | ICD-10-CM | POA: Diagnosis not present

## 2017-06-30 DIAGNOSIS — M47817 Spondylosis without myelopathy or radiculopathy, lumbosacral region: Secondary | ICD-10-CM | POA: Diagnosis not present

## 2017-07-03 ENCOUNTER — Other Ambulatory Visit: Payer: Self-pay | Admitting: Obstetrics and Gynecology

## 2017-07-03 ENCOUNTER — Ambulatory Visit
Admission: RE | Admit: 2017-07-03 | Discharge: 2017-07-03 | Disposition: A | Discharge status: Home / Self Care | Payer: BLUE CROSS/BLUE SHIELD | Source: Ambulatory Visit | Attending: Obstetrics and Gynecology | Admitting: Obstetrics and Gynecology

## 2017-07-03 DIAGNOSIS — N63 Unspecified lump in unspecified breast: Secondary | ICD-10-CM

## 2017-07-03 DIAGNOSIS — Z803 Family history of malignant neoplasm of breast: Secondary | ICD-10-CM

## 2017-07-03 DIAGNOSIS — N6489 Other specified disorders of breast: Secondary | ICD-10-CM | POA: Diagnosis not present

## 2017-11-03 ENCOUNTER — Other Ambulatory Visit: Payer: Self-pay | Admitting: Neurological Surgery

## 2017-11-03 DIAGNOSIS — M4316 Spondylolisthesis, lumbar region: Secondary | ICD-10-CM | POA: Diagnosis not present

## 2017-11-14 NOTE — Pre-Procedure Instructions (Signed)
Kristy Mendez  11/14/2017      [facility] [place], [place] - [address] AT [address] [facility] [place] [address] Phone: [phone] Fax: [phone]    Your procedure is scheduled on January 31  Report to [facility] at 1100 A.M.  Call this number if you have problems the morning of surgery:  [phone]   Remember:  Do not eat food or drink liquids after midnight.  Continue all medications as directed by your physician except follow these medication instructions before surgery below   Take these medicines the morning of surgery with A SIP OF WATER   oxyCODONE-acetaminophen (PERCOCET/ROXICET) if needed for pain  7 days prior to surgery STOP taking any Aspirin(unless otherwise instructed by your surgeon), Aleve, Naproxen, Ibuprofen, Motrin, Advil, Goody's, BC's, all herbal medications, fish oil, and all vitamins    Do not wear jewelry, make-up or nail polish.  Do not wear lotions, powders, or perfumes, or deodorant.  Do not shave 48 hours prior to surgery.  Men may shave face and neck.  Do not bring valuables to the hospital.  [facility] is not responsible for any belongings or valuables.  Contacts, dentures or bridgework may not be worn into surgery.  Leave your suitcase in the car.  After surgery it may be brought to your room.  For patients admitted to the hospital, discharge time will be determined by your treatment team.  Patients discharged the day of surgery will not be allowed to drive home.    Special instructions:   [place]- Preparing For Surgery  Before surgery, you can play an important role. Because skin is not sterile, your skin needs to be as free of germs as possible. You can reduce the number of germs on your skin by washing with CHG (chlorahexidine gluconate) Soap before surgery.  CHG is an antiseptic cleaner which kills germs and bonds with the skin to continue killing  germs even after washing.  Please do not use if you have an allergy to CHG or antibacterial soaps. If your skin becomes reddened/irritated stop using the CHG.  Do not shave (including legs and underarms) for at least 48 hours prior to first CHG shower. It is OK to shave your face.  Please follow these instructions carefully.   1. Shower the NIGHT BEFORE SURGERY and the MORNING OF SURGERY with CHG.   2. If you chose to wash your hair, wash your hair first as usual with your normal shampoo.  3. After you shampoo, rinse your hair and body thoroughly to remove the shampoo.  4. Use CHG as you would any other liquid soap. You can apply CHG directly to the skin and wash gently with a scrungie or a clean washcloth.   5. Apply the CHG Soap to your body ONLY FROM THE NECK DOWN.  Do not use on open wounds or open sores. Avoid contact with your eyes, ears, mouth and genitals (private parts). Wash Face and genitals (private parts)  with your normal soap.  6. Wash thoroughly, paying special attention to the area where your surgery will be performed.  7. Thoroughly rinse your body with warm water from the neck down.  8. DO NOT shower/wash with your normal soap after using and rinsing off the CHG Soap.  9. Pat yourself dry with a CLEAN TOWEL.  10. Wear CLEAN PAJAMAS to bed the night before surgery, wear  comfortable clothes the morning of surgery  11. Place CLEAN SHEETS on your bed the night of your first shower and DO NOT SLEEP WITH PETS.    Day of Surgery: Do not apply any deodorants/lotions. Please wear clean clothes to the hospital/surgery center.      Please read over the following fact sheets that you were given.

## 2017-11-17 ENCOUNTER — Encounter (HOSPITAL_COMMUNITY): Payer: Self-pay

## 2017-11-17 ENCOUNTER — Other Ambulatory Visit: Payer: Self-pay

## 2017-11-17 ENCOUNTER — Ambulatory Visit (HOSPITAL_COMMUNITY)
Admission: RE | Admit: 2017-11-17 | Discharge: 2017-11-17 | Disposition: A | Discharge status: Home / Self Care | Payer: BLUE CROSS/BLUE SHIELD | Source: Ambulatory Visit | Attending: Neurological Surgery | Admitting: Neurological Surgery

## 2017-11-17 ENCOUNTER — Encounter (HOSPITAL_COMMUNITY)
Admission: RE | Admit: 2017-11-17 | Discharge: 2017-11-17 | Disposition: A | Discharge status: Home / Self Care | Payer: BLUE CROSS/BLUE SHIELD | Source: Ambulatory Visit | Attending: Neurological Surgery | Admitting: Neurological Surgery

## 2017-11-17 DIAGNOSIS — E042 Nontoxic multinodular goiter: Secondary | ICD-10-CM | POA: Diagnosis not present

## 2017-11-17 DIAGNOSIS — R001 Bradycardia, unspecified: Secondary | ICD-10-CM | POA: Diagnosis not present

## 2017-11-17 DIAGNOSIS — Z01812 Encounter for preprocedural laboratory examination: Secondary | ICD-10-CM | POA: Diagnosis not present

## 2017-11-17 DIAGNOSIS — Z01818 Encounter for other preprocedural examination: Secondary | ICD-10-CM | POA: Insufficient documentation

## 2017-11-17 DIAGNOSIS — M4310 Spondylolisthesis, site unspecified: Secondary | ICD-10-CM | POA: Insufficient documentation

## 2017-11-17 DIAGNOSIS — Z01810 Encounter for preprocedural cardiovascular examination: Secondary | ICD-10-CM | POA: Diagnosis not present

## 2017-11-17 HISTORY — DX: Anxiety disorder, unspecified: F41.9

## 2017-11-17 LAB — BASIC METABOLIC PANEL
ANION GAP: 11 (ref 5–15)
BUN: 9 mg/dL (ref 6–20)
CO2: 24 mmol/L (ref 22–32)
Calcium: 9.7 mg/dL (ref 8.9–10.3)
Chloride: 105 mmol/L (ref 101–111)
Creatinine, Ser: 0.86 mg/dL (ref 0.44–1.00)
GFR calc Af Amer: >60 mL/min (ref >60)
Glucose, Bld: 91 mg/dL (ref 65–99)
POTASSIUM: 3.9 mmol/L (ref 3.5–5.1)
SODIUM: 140 mmol/L (ref 135–145)

## 2017-11-17 LAB — CBC WITH DIFFERENTIAL/PLATELET
BASOS ABS: 0.1 10*3/uL (ref 0.0–0.1)
BASOS PCT: 1 %
EOS PCT: 1 %
Eosinophils Absolute: 0.1 10*3/uL (ref 0.0–0.7)
HCT: 40.6 % (ref 36.0–46.0)
Hemoglobin: 13.6 g/dL (ref 12.0–15.0)
LYMPHS PCT: 27 %
Lymphs Abs: 2.4 10*3/uL (ref 0.7–4.0)
MCH: 31.4 pg (ref 26.0–34.0)
MCHC: 33.5 g/dL (ref 30.0–36.0)
MCV: 93.8 fL (ref 78.0–100.0)
Monocytes Absolute: 0.5 10*3/uL (ref 0.1–1.0)
Monocytes Relative: 6 %
NEUTROS ABS: 5.7 10*3/uL (ref 1.7–7.7)
Neutrophils Relative %: 65 %
PLATELETS: 309 10*3/uL (ref 150–400)
RBC: 4.33 MIL/uL (ref 3.87–5.11)
RDW: 13.3 % (ref 11.5–15.5)
WBC: 8.7 10*3/uL (ref 4.0–10.5)

## 2017-11-17 LAB — TYPE AND SCREEN
ABO/RH(D): O POS
Antibody Screen: NEGATIVE

## 2017-11-17 LAB — PROTIME-INR
INR: 1.19
PROTHROMBIN TIME: 15 s (ref 11.4–15.2)

## 2017-11-17 LAB — SURGICAL PCR SCREEN
MRSA, PCR: NEGATIVE
Staphylococcus aureus: NEGATIVE

## 2017-11-17 LAB — HCG, SERUM, QUALITATIVE: PREG SERUM: NEGATIVE

## 2017-11-17 IMAGING — CR DG CHEST 2V
2 series · 2 of 2 positions shown · non-contrast
Comparison: None.

CLINICAL DATA: Pre op for [DATE] for lumbar surgery, past smoker -
quit 2 months ago, pt shielded

EXAM:
CHEST  2 VIEW

[w chest pa]
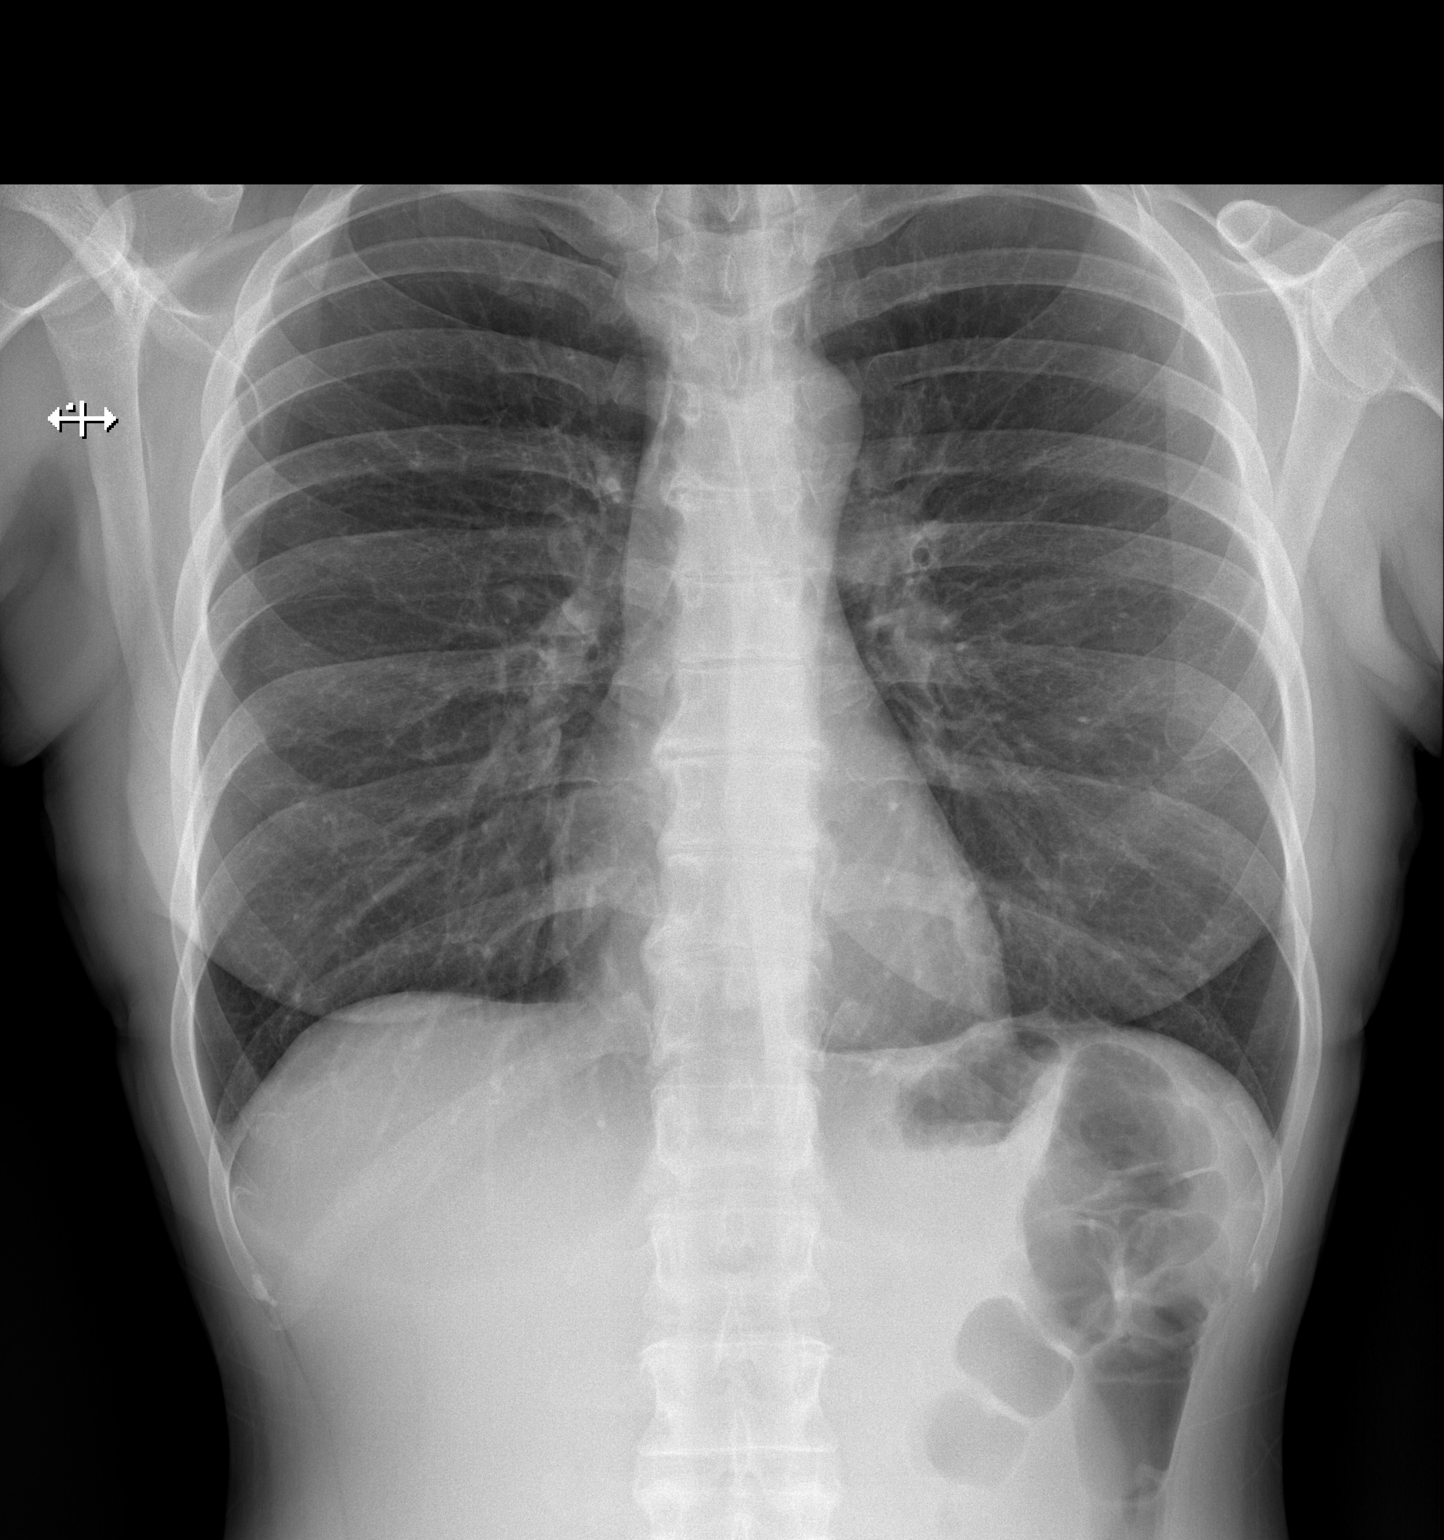

[w chest lat]
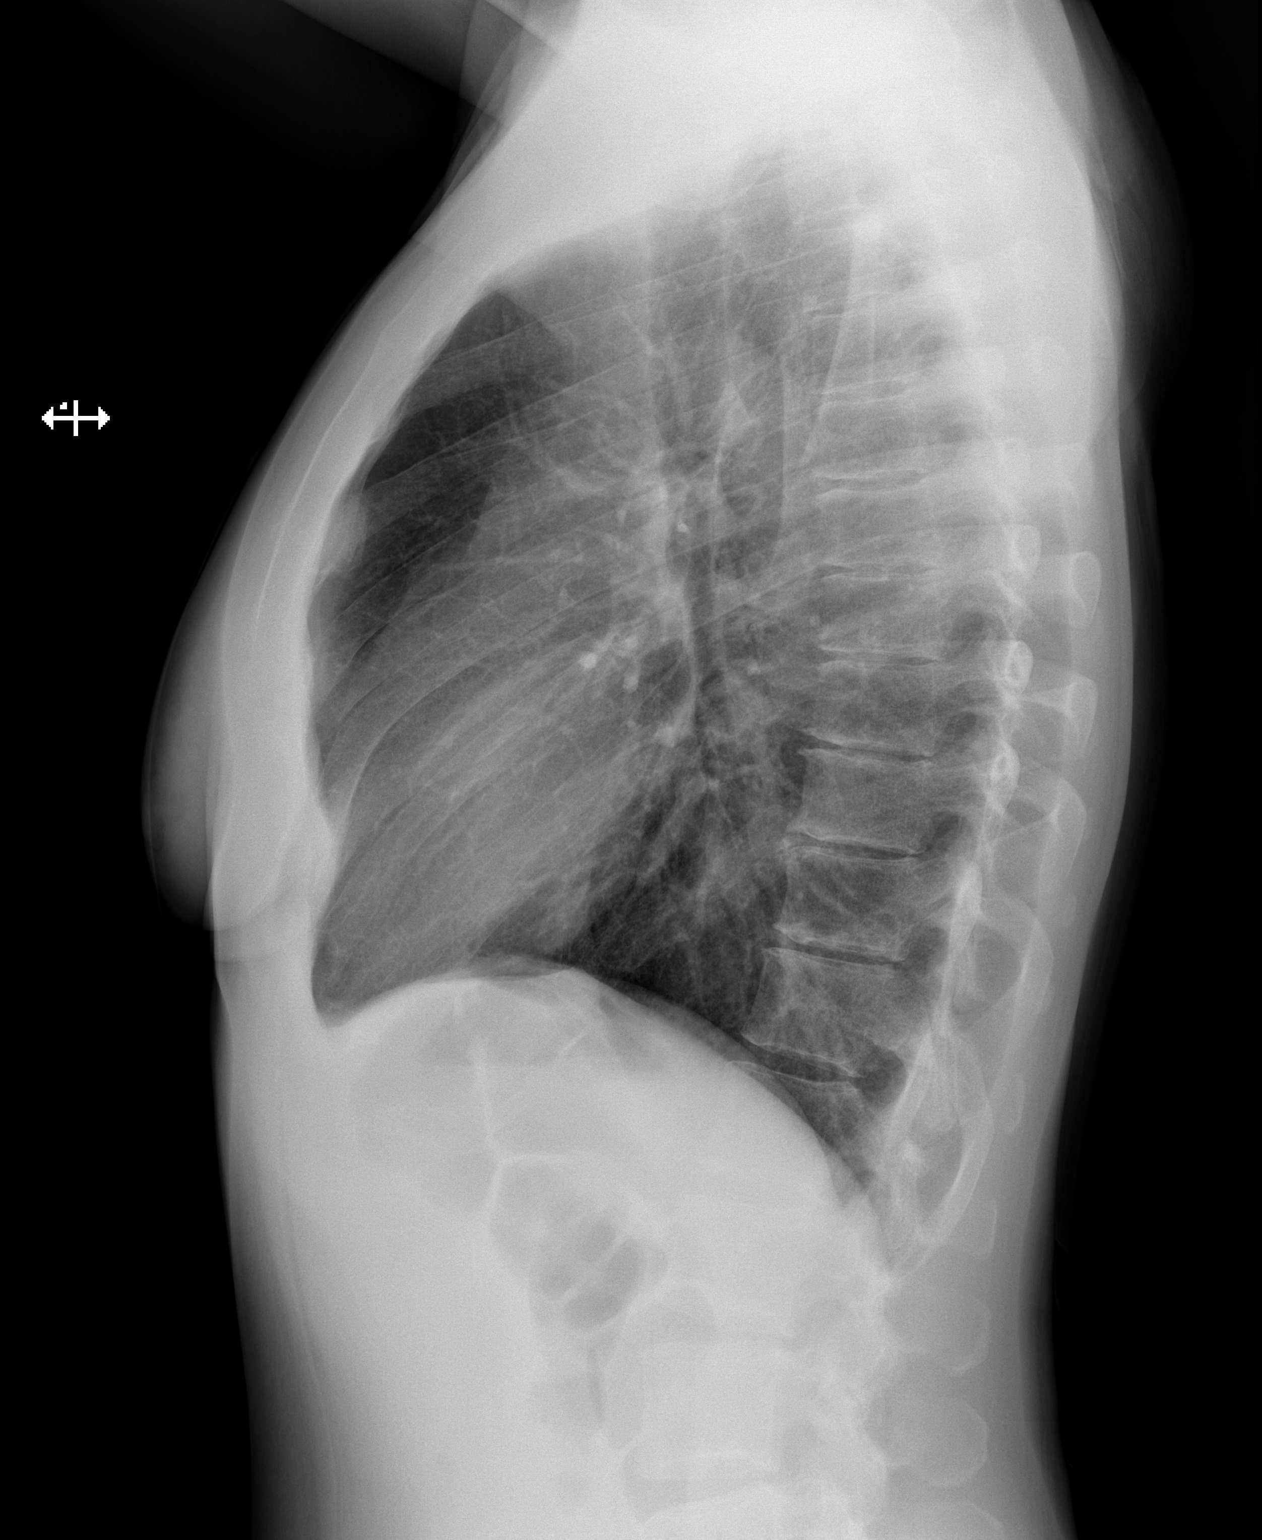

[2 of 2 positions shown; findings below may reference images not displayed]

FINDINGS: The heart size and mediastinal contours are within normal limits.
Both lungs are clear. The visualized skeletal structures are
unremarkable.
IMPRESSION: No active cardiopulmonary disease.

## 2017-11-17 NOTE — Progress Notes (Addendum)
PCP: Denies--states she uses Urgent Care when needed Cardiologist: Denies  EKG: Today--sinus brady. Pt states she normally keeps a low pulse. Palpated pt's pulse after EKG, 58. CXR: Today ECHO: Denies Stress Test: Denies Cardiac Cath: Denies  Patient denies shortness of breath, fever, cough, and chest pain at PAT appointment.  Patient verbalized understanding of instructions provided today at the PAT appointment.  Patient asked to review instructions at home and day of surgery.

## 2017-11-20 ENCOUNTER — Inpatient Hospital Stay (HOSPITAL_COMMUNITY)
Admission: RE | Admit: 2017-11-20 | Discharge: 2017-11-21 | DRG: 455 | Disposition: A | Discharge status: Home / Self Care | Payer: BLUE CROSS/BLUE SHIELD | Source: Ambulatory Visit | Attending: Neurological Surgery | Admitting: Neurological Surgery

## 2017-11-20 ENCOUNTER — Other Ambulatory Visit: Payer: Self-pay

## 2017-11-20 ENCOUNTER — Encounter (HOSPITAL_COMMUNITY): Payer: Self-pay

## 2017-11-20 ENCOUNTER — Inpatient Hospital Stay (HOSPITAL_COMMUNITY)
Admission: RE | Disposition: A | Discharge status: Home / Self Care | Payer: Self-pay | Source: Ambulatory Visit | Attending: Neurological Surgery

## 2017-11-20 ENCOUNTER — Inpatient Hospital Stay (HOSPITAL_COMMUNITY): Payer: BLUE CROSS/BLUE SHIELD

## 2017-11-20 ENCOUNTER — Inpatient Hospital Stay (HOSPITAL_COMMUNITY): Payer: BLUE CROSS/BLUE SHIELD | Admitting: Anesthesiology

## 2017-11-20 DIAGNOSIS — E042 Nontoxic multinodular goiter: Secondary | ICD-10-CM | POA: Diagnosis not present

## 2017-11-20 DIAGNOSIS — M532X6 Spinal instabilities, lumbar region: Secondary | ICD-10-CM | POA: Diagnosis not present

## 2017-11-20 DIAGNOSIS — Z419 Encounter for procedure for purposes other than remedying health state, unspecified: Secondary | ICD-10-CM

## 2017-11-20 DIAGNOSIS — M48061 Spinal stenosis, lumbar region without neurogenic claudication: Principal | ICD-10-CM | POA: Diagnosis present

## 2017-11-20 DIAGNOSIS — Z981 Arthrodesis status: Secondary | ICD-10-CM

## 2017-11-20 IMAGING — RF DG LUMBAR SPINE 2-3V
1 series · 2 of 2 positions shown · non-contrast
Comparison: [DATE]

CLINICAL DATA: PLIF L3-4

EXAM:
DG C-ARM 61-120 MIN; LUMBAR SPINE - 2-3 VIEW

[Series 1: run · 2 of 2 slices shown]
[im 1/2]
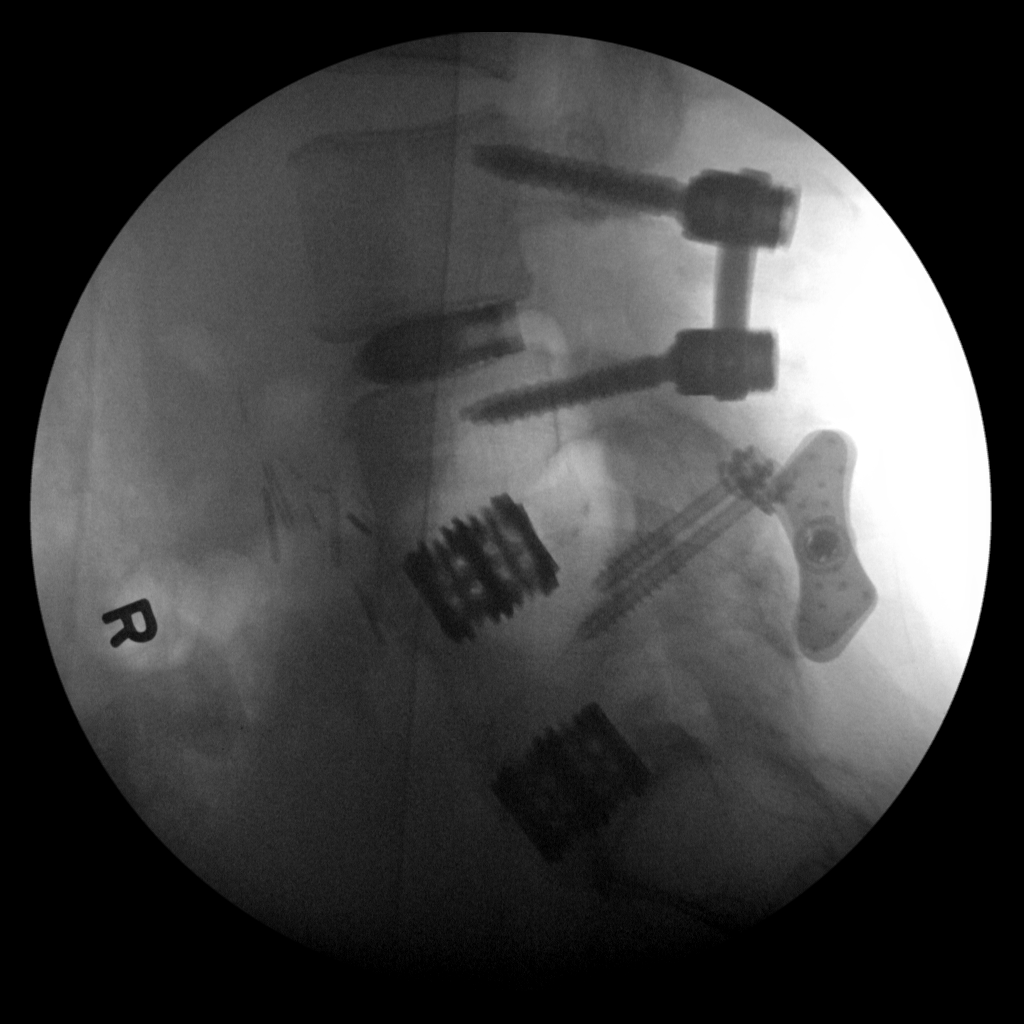
[im 2/2]
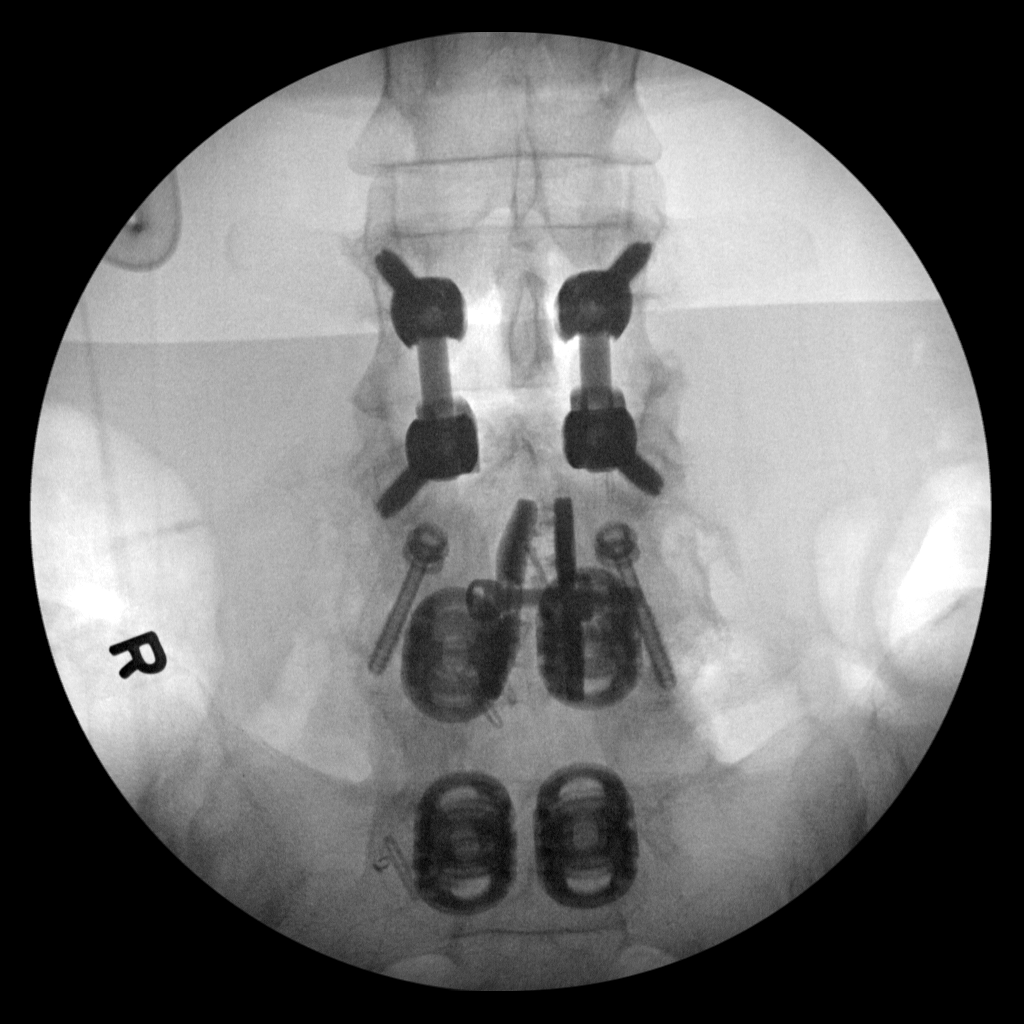

[2 of 2 positions shown; findings below may reference images not displayed]

FINDINGS: Compared to the prior study, patient has had interval discectomy at
L3-4 with placement of bilateral pedicle screws at the L3 and L4
levels. Previous fusion hardware at L4-5 appears similar to the
preoperative exam.
IMPRESSION: Intraoperative evaluation during PLIF at L3-4.

## 2017-11-20 SURGERY — POSTERIOR LUMBAR FUSION 1 LEVEL
Anesthesia: General | Site: Back

## 2017-11-20 MED ORDER — EPHEDRINE SULFATE-NACL 50-0.9 MG/10ML-% IV SOSY
PREFILLED_SYRINGE | INTRAVENOUS | Status: DC | PRN
  Administered 2017-11-20: 5 mg via INTRAVENOUS
  Administered 2017-11-20: 10 mg via INTRAVENOUS
  Administered 2017-11-20 (×2): 5 mg via INTRAVENOUS

## 2017-11-20 MED ORDER — METHOCARBAMOL 500 MG PO TABS
500.0000 mg | ORAL_TABLET | Freq: Four times a day (QID) | ORAL | Status: DC | PRN
  Administered 2017-11-20 – 2017-11-21 (×3): 500 mg via ORAL
  Filled 2017-11-20 (×3): qty 1

## 2017-11-20 MED ORDER — FENTANYL CITRATE (PF) 100 MCG/2ML IJ SOLN
INTRAMUSCULAR | Status: DC | PRN
  Administered 2017-11-20: 100 ug via INTRAVENOUS
  Administered 2017-11-20 (×4): 50 ug via INTRAVENOUS
  Administered 2017-11-20: 100 ug via INTRAVENOUS

## 2017-11-20 MED ORDER — THROMBIN 5000 UNITS EX SOLR
CUTANEOUS | Status: AC
  Filled 2017-11-20: qty 5000

## 2017-11-20 MED ORDER — PHENYLEPHRINE HCL 10 MG/ML IJ SOLN
INTRAMUSCULAR | Status: DC | PRN
  Administered 2017-11-20: 25 ug/min via INTRAVENOUS
  Administered 2017-11-20: 15 ug/min via INTRAVENOUS

## 2017-11-20 MED ORDER — CEFAZOLIN SODIUM-DEXTROSE 2-4 GM/100ML-% IV SOLN
2.0000 g | INTRAVENOUS | Status: AC
  Administered 2017-11-20: 2 g via INTRAVENOUS
  Filled 2017-11-20: qty 100

## 2017-11-20 MED ORDER — ACETAMINOPHEN 650 MG RE SUPP: 650.0000 mg | RECTAL | Status: DC | PRN

## 2017-11-20 MED ORDER — BUPIVACAINE HCL (PF) 0.25 % IJ SOLN
INTRAMUSCULAR | Status: AC
  Filled 2017-11-20: qty 30

## 2017-11-20 MED ORDER — ONDANSETRON HCL 4 MG/2ML IJ SOLN
INTRAMUSCULAR | Status: DC | PRN
  Administered 2017-11-20: 4 mg via INTRAVENOUS

## 2017-11-20 MED ORDER — OXYCODONE HCL 5 MG PO TABS: 5.0000 mg | ORAL_TABLET | ORAL | Status: DC | PRN

## 2017-11-20 MED ORDER — OXYCODONE HCL 5 MG PO TABS
5.0000 mg | ORAL_TABLET | ORAL | Status: DC | PRN
  Administered 2017-11-20: 5 mg via ORAL

## 2017-11-20 MED ORDER — ACETAMINOPHEN 325 MG PO TABS
975.0000 mg | ORAL_TABLET | Freq: Once | ORAL | Status: AC
  Administered 2017-11-20: 975 mg via ORAL

## 2017-11-20 MED ORDER — HEMOSTATIC AGENTS (NO CHARGE) OPTIME
TOPICAL | Status: DC | PRN
  Administered 2017-11-20: 1 via TOPICAL

## 2017-11-20 MED ORDER — FENTANYL CITRATE (PF) 100 MCG/2ML IJ SOLN
50.0000 ug | Freq: Once | INTRAMUSCULAR | Status: AC
  Administered 2017-11-20: 50 ug via INTRAVENOUS

## 2017-11-20 MED ORDER — CELECOXIB 200 MG PO CAPS
200.0000 mg | ORAL_CAPSULE | Freq: Two times a day (BID) | ORAL | Status: DC
  Administered 2017-11-20 – 2017-11-21 (×2): 200 mg via ORAL
  Filled 2017-11-20 (×2): qty 1

## 2017-11-20 MED ORDER — MENTHOL 3 MG MT LOZG: 1.0000 | LOZENGE | OROMUCOSAL | Status: DC | PRN

## 2017-11-20 MED ORDER — THROMBIN (RECOMBINANT) 5000 UNITS EX SOLR
OROMUCOSAL | Status: DC | PRN
  Administered 2017-11-20: 5 mL via TOPICAL

## 2017-11-20 MED ORDER — FENTANYL CITRATE (PF) 250 MCG/5ML IJ SOLN
INTRAMUSCULAR | Status: AC
  Filled 2017-11-20: qty 5

## 2017-11-20 MED ORDER — LACTATED RINGERS IV SOLN
INTRAVENOUS | Status: DC | PRN
  Administered 2017-11-20 (×2): via INTRAVENOUS

## 2017-11-20 MED ORDER — PROPOFOL 10 MG/ML IV BOLUS
INTRAVENOUS | Status: DC | PRN
  Administered 2017-11-20: 150 mg via INTRAVENOUS

## 2017-11-20 MED ORDER — SODIUM CHLORIDE 0.9 % IR SOLN
Status: DC | PRN
  Administered 2017-11-20: 500 mL

## 2017-11-20 MED ORDER — PROPOFOL 10 MG/ML IV BOLUS
INTRAVENOUS | Status: AC
  Filled 2017-11-20: qty 20

## 2017-11-20 MED ORDER — SUGAMMADEX SODIUM 200 MG/2ML IV SOLN
INTRAVENOUS | Status: AC
  Filled 2017-11-20: qty 2

## 2017-11-20 MED ORDER — ROCURONIUM BROMIDE 10 MG/ML (PF) SYRINGE
PREFILLED_SYRINGE | INTRAVENOUS | Status: AC
  Filled 2017-11-20: qty 5

## 2017-11-20 MED ORDER — ARTIFICIAL TEARS OPHTHALMIC OINT
TOPICAL_OINTMENT | OPHTHALMIC | Status: AC
  Filled 2017-11-20: qty 3.5

## 2017-11-20 MED ORDER — CEFAZOLIN SODIUM-DEXTROSE 2-4 GM/100ML-% IV SOLN
2.0000 g | Freq: Three times a day (TID) | INTRAVENOUS | Status: AC
  Administered 2017-11-20 – 2017-11-21 (×2): 2 g via INTRAVENOUS
  Filled 2017-11-20 (×2): qty 100

## 2017-11-20 MED ORDER — VANCOMYCIN HCL 1000 MG IV SOLR
INTRAVENOUS | Status: AC
  Filled 2017-11-20: qty 1000

## 2017-11-20 MED ORDER — ACETAMINOPHEN 325 MG PO TABS
650.0000 mg | ORAL_TABLET | ORAL | Status: DC | PRN
Start: 2017-11-20 — End: 2017-11-21
  Administered 2017-11-20 – 2017-11-21 (×2): 650 mg via ORAL
  Filled 2017-11-20 (×2): qty 2

## 2017-11-20 MED ORDER — OXYCODONE HCL 5 MG PO TABS
ORAL_TABLET | ORAL | Status: AC
  Filled 2017-11-20: qty 1

## 2017-11-20 MED ORDER — DEXAMETHASONE SODIUM PHOSPHATE 10 MG/ML IJ SOLN
INTRAMUSCULAR | Status: AC
  Filled 2017-11-20: qty 1

## 2017-11-20 MED ORDER — SODIUM CHLORIDE 0.9 % IV SOLN: 250.0000 mL | INTRAVENOUS | Status: DC

## 2017-11-20 MED ORDER — DEXAMETHASONE SODIUM PHOSPHATE 10 MG/ML IJ SOLN
10.0000 mg | INTRAMUSCULAR | Status: AC
  Administered 2017-11-20: 10 mg via INTRAVENOUS
  Filled 2017-11-20: qty 1

## 2017-11-20 MED ORDER — HYDROMORPHONE HCL 1 MG/ML IJ SOLN
INTRAMUSCULAR | Status: AC
  Filled 2017-11-20: qty 1

## 2017-11-20 MED ORDER — LIDOCAINE HCL (CARDIAC) 20 MG/ML IV SOLN
INTRAVENOUS | Status: DC | PRN
  Administered 2017-11-20: 100 mg via INTRAVENOUS

## 2017-11-20 MED ORDER — HYDROMORPHONE HCL 1 MG/ML IJ SOLN
1.0000 mg | INTRAMUSCULAR | Status: DC | PRN
  Administered 2017-11-20: 1 mg via INTRAVENOUS
  Administered 2017-11-21: 0.5 mg via INTRAVENOUS
  Filled 2017-11-20 (×2): qty 1

## 2017-11-20 MED ORDER — SUGAMMADEX SODIUM 200 MG/2ML IV SOLN
INTRAVENOUS | Status: DC | PRN
  Administered 2017-11-20: 150 mg via INTRAVENOUS

## 2017-11-20 MED ORDER — PHENOL 1.4 % MT LIQD: 1.0000 | OROMUCOSAL | Status: DC | PRN

## 2017-11-20 MED ORDER — OXYCODONE HCL 5 MG PO TABS
10.0000 mg | ORAL_TABLET | ORAL | Status: DC | PRN
  Administered 2017-11-20 – 2017-11-21 (×3): 10 mg via ORAL
  Filled 2017-11-20 (×3): qty 2

## 2017-11-20 MED ORDER — VANCOMYCIN HCL 1000 MG IV SOLR
INTRAVENOUS | Status: DC | PRN
  Administered 2017-11-20: 1000 mg

## 2017-11-20 MED ORDER — ONDANSETRON HCL 4 MG PO TABS: 4.0000 mg | ORAL_TABLET | Freq: Four times a day (QID) | ORAL | Status: DC | PRN

## 2017-11-20 MED ORDER — SODIUM CHLORIDE 0.9% FLUSH: 3.0000 mL | INTRAVENOUS | Status: DC | PRN

## 2017-11-20 MED ORDER — HYDROMORPHONE HCL 1 MG/ML IJ SOLN
0.2500 mg | INTRAMUSCULAR | Status: DC | PRN
  Administered 2017-11-20 (×4): 0.5 mg via INTRAVENOUS

## 2017-11-20 MED ORDER — ONDANSETRON HCL 4 MG/2ML IJ SOLN
INTRAMUSCULAR | Status: AC
Start: 2017-11-20 — End: 2017-11-20
  Filled 2017-11-20: qty 2

## 2017-11-20 MED ORDER — ACETAMINOPHEN 325 MG PO TABS
ORAL_TABLET | ORAL | Status: AC
  Administered 2017-11-20: 975 mg via ORAL
  Filled 2017-11-20: qty 3

## 2017-11-20 MED ORDER — KETOROLAC TROMETHAMINE 30 MG/ML IJ SOLN
INTRAMUSCULAR | Status: DC | PRN
  Administered 2017-11-20: 30 mg via INTRAVENOUS

## 2017-11-20 MED ORDER — 0.9 % SODIUM CHLORIDE (POUR BTL) OPTIME
TOPICAL | Status: DC | PRN
  Administered 2017-11-20: 1000 mL

## 2017-11-20 MED ORDER — POTASSIUM CHLORIDE IN NACL 20-0.9 MEQ/L-% IV SOLN: INTRAVENOUS | Status: DC

## 2017-11-20 MED ORDER — THROMBIN 20000 UNITS EX SOLR
CUTANEOUS | Status: AC
  Filled 2017-11-20: qty 20000

## 2017-11-20 MED ORDER — CHLORHEXIDINE GLUCONATE CLOTH 2 % EX PADS: 6.0000 | MEDICATED_PAD | Freq: Once | CUTANEOUS | Status: DC

## 2017-11-20 MED ORDER — MIDAZOLAM HCL 5 MG/5ML IJ SOLN
INTRAMUSCULAR | Status: DC | PRN
  Administered 2017-11-20: 2 mg via INTRAVENOUS

## 2017-11-20 MED ORDER — MIDAZOLAM HCL 2 MG/2ML IJ SOLN
INTRAMUSCULAR | Status: AC
  Filled 2017-11-20: qty 2

## 2017-11-20 MED ORDER — FENTANYL CITRATE (PF) 100 MCG/2ML IJ SOLN
INTRAMUSCULAR | Status: AC
  Administered 2017-11-20: 50 ug via INTRAVENOUS
  Filled 2017-11-20: qty 2

## 2017-11-20 MED ORDER — SENNA 8.6 MG PO TABS
1.0000 | ORAL_TABLET | Freq: Two times a day (BID) | ORAL | Status: DC
  Administered 2017-11-20: 8.6 mg via ORAL
  Filled 2017-11-20 (×2): qty 1

## 2017-11-20 MED ORDER — KETOROLAC TROMETHAMINE 30 MG/ML IJ SOLN
INTRAMUSCULAR | Status: AC
  Filled 2017-11-20: qty 1

## 2017-11-20 MED ORDER — DEXTROSE 5 % IV SOLN
500.0000 mg | Freq: Four times a day (QID) | INTRAVENOUS | Status: DC | PRN
  Administered 2017-11-21: 500 mg via INTRAVENOUS
  Filled 2017-11-20 (×3): qty 5

## 2017-11-20 MED ORDER — LIDOCAINE 2% (20 MG/ML) 5 ML SYRINGE
INTRAMUSCULAR | Status: AC
  Filled 2017-11-20: qty 5

## 2017-11-20 MED ORDER — SODIUM CHLORIDE 0.9% FLUSH: 3.0000 mL | Freq: Two times a day (BID) | INTRAVENOUS | Status: DC

## 2017-11-20 MED ORDER — ONDANSETRON HCL 4 MG/2ML IJ SOLN: 4.0000 mg | Freq: Four times a day (QID) | INTRAMUSCULAR | Status: DC | PRN

## 2017-11-20 MED ORDER — ACETAMINOPHEN 10 MG/ML IV SOLN
INTRAVENOUS | Status: AC
  Filled 2017-11-20: qty 100

## 2017-11-20 MED ORDER — ROCURONIUM BROMIDE 100 MG/10ML IV SOLN
INTRAVENOUS | Status: DC | PRN
  Administered 2017-11-20: 50 mg via INTRAVENOUS

## 2017-11-20 SURGICAL SUPPLY — 61 items
ATEC PORO TI PS 10D 9W 25X8X10 (Bone Implant) ×4 IMPLANT
BAG DECANTER FOR FLEXI CONT (MISCELLANEOUS) ×2 IMPLANT
BASKET BONE COLLECTION (BASKET) ×2 IMPLANT
BENZOIN TINCTURE PRP APPL 2/3 (GAUZE/BANDAGES/DRESSINGS) ×2 IMPLANT
BLADE CLIPPER SURG (BLADE) IMPLANT
BUR MATCHSTICK NEURO 3.0 LAGG (BURR) ×2 IMPLANT
CANISTER SUCT 3000ML PPV (MISCELLANEOUS) ×2 IMPLANT
CARTRIDGE OIL MAESTRO DRILL (MISCELLANEOUS) ×1 IMPLANT
CONT SPEC 4OZ CLIKSEAL STRL BL (MISCELLANEOUS) ×2 IMPLANT
COVER BACK TABLE 60X90IN (DRAPES) ×2 IMPLANT
DERMABOND ADVANCED (GAUZE/BANDAGES/DRESSINGS) ×1
DERMABOND ADVANCED .7 DNX12 (GAUZE/BANDAGES/DRESSINGS) ×1 IMPLANT
DIFFUSER DRILL AIR PNEUMATIC (MISCELLANEOUS) ×2 IMPLANT
DRAPE C-ARM 42X72 X-RAY (DRAPES) ×2 IMPLANT
DRAPE LAPAROTOMY 100X72X124 (DRAPES) ×2 IMPLANT
DRAPE POUCH INSTRU U-SHP 10X18 (DRAPES) ×2 IMPLANT
DRAPE SURG 17X23 STRL (DRAPES) ×2 IMPLANT
DRSG OPSITE POSTOP 4X6 (GAUZE/BANDAGES/DRESSINGS) ×2 IMPLANT
DURAPREP 26ML APPLICATOR (WOUND CARE) ×2 IMPLANT
ELECT REM PT RETURN 9FT ADLT (ELECTROSURGICAL) ×2
ELECTRODE REM PT RTRN 9FT ADLT (ELECTROSURGICAL) ×1 IMPLANT
EVACUATOR 1/8 PVC DRAIN (DRAIN) IMPLANT
GAUZE SPONGE 4X4 16PLY XRAY LF (GAUZE/BANDAGES/DRESSINGS) IMPLANT
GLOVE BIO SURGEON STRL SZ7 (GLOVE) IMPLANT
GLOVE BIO SURGEON STRL SZ8 (GLOVE) ×4 IMPLANT
GLOVE BIO SURGEON STRL SZ8.5 (GLOVE) ×2 IMPLANT
GLOVE BIOGEL PI IND STRL 7.0 (GLOVE) ×4 IMPLANT
GLOVE BIOGEL PI IND STRL 7.5 (GLOVE) ×2 IMPLANT
GLOVE BIOGEL PI INDICATOR 7.0 (GLOVE) ×4
GLOVE BIOGEL PI INDICATOR 7.5 (GLOVE) ×2
GLOVE SURG SS PI 7.5 STRL IVOR (GLOVE) ×10 IMPLANT
GOWN STRL REUS W/ TWL LRG LVL3 (GOWN DISPOSABLE) ×2 IMPLANT
GOWN STRL REUS W/ TWL XL LVL3 (GOWN DISPOSABLE) ×2 IMPLANT
GOWN STRL REUS W/TWL 2XL LVL3 (GOWN DISPOSABLE) IMPLANT
GOWN STRL REUS W/TWL LRG LVL3 (GOWN DISPOSABLE) ×2
GOWN STRL REUS W/TWL XL LVL3 (GOWN DISPOSABLE) ×2
HEMOSTAT POWDER KIT SURGIFOAM (HEMOSTASIS) ×2 IMPLANT
KIT BASIN OR (CUSTOM PROCEDURE TRAY) ×2 IMPLANT
KIT ROOM TURNOVER OR (KITS) ×2 IMPLANT
MILL MEDIUM DISP (BLADE) IMPLANT
NEEDLE HYPO 25X1 1.5 SAFETY (NEEDLE) ×2 IMPLANT
NS IRRIG 1000ML POUR BTL (IV SOLUTION) ×2 IMPLANT
OIL CARTRIDGE MAESTRO DRILL (MISCELLANEOUS) ×2
PACK LAMINECTOMY NEURO (CUSTOM PROCEDURE TRAY) ×2 IMPLANT
PAD ARMBOARD 7.5X6 YLW CONV (MISCELLANEOUS) ×6 IMPLANT
PUTTY DBM ALLOSYNC PURE 5CC (Putty) ×2 IMPLANT
ROD PC 5.5X35 TI ARSENAL (Rod) ×4 IMPLANT
SCREW CBX 5.5X35MM (Screw) ×8 IMPLANT
SCREW SET SPINAL ARSENAL 47127 (Screw) ×8 IMPLANT
SPACER PORUS ATEC 10D9W25X8X10 (Bone Implant) ×2 IMPLANT
SPONGE LAP 4X18 X RAY DECT (DISPOSABLE) IMPLANT
SPONGE SURGIFOAM ABS GEL 100 (HEMOSTASIS) ×2 IMPLANT
STRIP CLOSURE SKIN 1/2X4 (GAUZE/BANDAGES/DRESSINGS) ×4 IMPLANT
SUT VIC AB 0 CT1 18XCR BRD8 (SUTURE) ×1 IMPLANT
SUT VIC AB 0 CT1 8-18 (SUTURE) ×1
SUT VIC AB 2-0 CP2 18 (SUTURE) ×2 IMPLANT
SUT VIC AB 3-0 SH 8-18 (SUTURE) ×4 IMPLANT
TOWEL GREEN STERILE (TOWEL DISPOSABLE) ×2 IMPLANT
TOWEL GREEN STERILE FF (TOWEL DISPOSABLE) ×2 IMPLANT
TRAY FOLEY W/METER SILVER 16FR (SET/KITS/TRAYS/PACK) ×2 IMPLANT
WATER STERILE IRR 1000ML POUR (IV SOLUTION) ×2 IMPLANT

## 2017-11-20 NOTE — Anesthesia Preprocedure Evaluation (Signed)
Anesthesia Evaluation  Patient identified by MRN, date of birth, ID band Patient awake    Reviewed: Allergy & Precautions, H&P , Patient's Chart, lab work & pertinent test results, reviewed documented beta blocker date and time   Airway Mallampati: II  TM Distance: >3 FB Neck ROM: full    Dental no notable dental hx.    Pulmonary    Pulmonary exam normal breath sounds clear to auscultation       Cardiovascular  Rhythm:regular Rate:Normal     Neuro/Psych    GI/Hepatic   Endo/Other    Renal/GU      Musculoskeletal   Abdominal   Peds  Hematology   Anesthesia Other Findings   Reproductive/Obstetrics                             Anesthesia Physical Anesthesia Plan  ASA: II  Anesthesia Plan: General   Post-op Pain Management:    Induction: Intravenous  PONV Risk Score and Plan: 2 and Dexamethasone, Ondansetron, Treatment may vary due to age or medical condition and Scopolamine patch - Pre-op  Airway Management Planned: Oral ETT  Additional Equipment:   Intra-op Plan:   Post-operative Plan: Extubation in OR  Informed Consent: I have reviewed the patients History and Physical, chart, labs and discussed the procedure including the risks, benefits and alternatives for the proposed anesthesia with the patient or authorized representative who has indicated his/her understanding and acceptance.   Dental Advisory Given  Plan Discussed with: CRNA and Surgeon  Anesthesia Plan Comments: (  )        Anesthesia Quick Evaluation

## 2017-11-20 NOTE — H&P (Signed)
Subjective: Patient is a 42 y.o. female admitted for PLIF. Onset of symptoms was several years ago, gradually worsening since that time.  The pain is rated severe, unremitting, and is located at the across the lower back and radiates to legs. The pain is described as aching and occurs all day. The symptoms have been progressive. Symptoms are exacerbated by exercise. MRI or CT showed adjacent stenosis with instability   Past Medical History:  Diagnosis Date  . Anxiety   . Arthritis   . Blood transfusion without reported diagnosis     Past Surgical History:  Procedure Laterality Date  . [place] SURGERY  1998  . LUMBAR FUSION  2002, 2011    Prior to Admission medications   Medication Sig Start Date End Date Taking? Authorizing Provider  acetaminophen (TYLENOL) 500 MG tablet Take 1,000 mg by mouth every 6 (six) hours as needed for moderate pain.   Yes [provider]  ibuprofen (ADVIL,MOTRIN) 200 MG tablet Take 600 mg by mouth 2 (two) times daily as needed for headache or moderate pain.   Yes [provider]  oxyCODONE-acetaminophen (PERCOCET/ROXICET) 5-325 MG tablet Take 1 tablet by mouth 2 (two) times daily as needed for severe pain.   Yes [provider]  levonorgestrel (MIRENA) 20 MCG/24HR IUD 1 each by Intrauterine route once.    [provider]   No Known Allergies  Social History   Tobacco Use  . Smoking status: Never Smoker  . Smokeless tobacco: Never Used  Substance Use Topics  . Alcohol use: No    Frequency: Never    Family History  Problem Relation Age of Onset  . Cancer Sister        Hodgkins Disease  . Cancer Paternal Grandmother        Colon Cancer     Review of Systems  Positive ROS: negative  All other systems have been reviewed and were otherwise negative with the exception of those mentioned in the HPI and as above.  Objective: Vital signs in last 24 hours: Temp:  [98.2 F (36.8 C)] 98.2 F (36.8 C) (01/31  1131) Pulse Rate:  [82] 82 (01/31 1131) Resp:  [18] 18 (01/31 1131) BP: (121)/(65) 121/65 (01/31 1131) SpO2:  [100 %] 100 % (01/31 1131) Weight:  [66.8 kg (147 lb 3.2 oz)] 66.8 kg (147 lb 3.2 oz) (01/31 1131)  General Appearance: Alert, cooperative, no distress, appears stated age Head: Normocephalic, without obvious abnormality, atraumatic Eyes: PERRL, conjunctiva/corneas clear, EOM's intact    Neck: Supple, symmetrical, trachea midline Back: Symmetric, no curvature, ROM normal, no CVA tenderness Lungs:  respirations unlabored Heart: Regular rate and rhythm Abdomen: Soft, non-tender Extremities: Extremities normal, atraumatic, no cyanosis or edema Pulses: 2+ and symmetric all extremities Skin: Skin color, texture, turgor normal, no rashes or lesions  NEUROLOGIC:   Mental status: Alert and oriented x4,  no aphasia, good attention span, fund of knowledge, and memory Motor Exam - grossly normal Sensory Exam - grossly normal Reflexes: 1+ Coordination - grossly normal Gait - grossly normal Balance - grossly normal Cranial Nerves: I: smell Not tested  II: visual acuity  OS: nl    OD: nl  II: visual fields Full to confrontation  II: pupils Equal, round, reactive to light  III,VII: ptosis None  III,IV,VI: extraocular muscles  Full ROM  V: mastication Normal  V: facial light touch sensation  Normal  V,VII: corneal reflex  Present  VII: facial muscle function - upper  Normal  VII:  facial muscle function - lower Normal  VIII: hearing Not tested  IX: soft palate elevation  Normal  IX,X: gag reflex Present  XI: trapezius strength  5/5  XI: sternocleidomastoid strength 5/5  XI: neck flexion strength  5/5  XII: tongue strength  Normal    Data Review Lab Results  Component Value Date   WBC 8.7 11/17/2017   HGB 13.6 11/17/2017   HCT 40.6 11/17/2017   MCV 93.8 11/17/2017   PLT 309 11/17/2017   Lab Results  Component Value Date   NA 140 11/17/2017   K 3.9 11/17/2017   CL  105 11/17/2017   CO2 24 11/17/2017   BUN 9 11/17/2017   CREATININE 0.86 11/17/2017   GLUCOSE 91 11/17/2017   Lab Results  Component Value Date   INR 1.19 11/17/2017    Assessment/Plan:  Estimated body mass index is 22.55 kg/m as calculated from the following:   Height as of this encounter: 5' 7.75" (1.721 m).   Weight as of this encounter: 66.8 kg (147 lb 3.2 oz). Patient admitted for PLIF L3-4. Patient has failed a reasonable attempt at conservative therapy.  I explained the condition and procedure to the patient and answered any questions.  Patient wishes to proceed with procedure as planned. Understands risks/ benefits and typical outcomes of procedure.   JONES,DAVID S 11/20/2017 12:31 PM

## 2017-11-20 NOTE — Anesthesia Procedure Notes (Signed)
Procedure Name: Intubation Date/Time: 11/20/2017 1:17 PM Performed by: Lowella Dell, CRNA Pre-anesthesia Checklist: Patient identified, Emergency Drugs available, Suction available and Patient being monitored Patient Re-evaluated:Patient Re-evaluated prior to induction Oxygen Delivery Method: Circle System Utilized Preoxygenation: Pre-oxygenation with 100% oxygen Induction Type: IV induction Ventilation: Mask ventilation without difficulty Laryngoscope Size: Mac and 3 Grade View: Grade I Tube type: Oral Tube size: 7.0 mm Number of attempts: 1 Airway Equipment and Method: Stylet Placement Confirmation: ETT inserted through vocal cords under direct vision,  positive ETCO2 and breath sounds checked- equal and bilateral Secured at: 22 cm Tube secured with: Tape Dental Injury: Teeth and Oropharynx as per pre-operative assessment

## 2017-11-20 NOTE — Op Note (Signed)
11/20/2017  3:48 PM  PATIENT:  Kristy Mendez  42 y.o. 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 Decompressive lumbar laminectomy L3-4 requiring more work than would be required for a simple exposure of the disk for PLIF in order to adequately decompress the neural elements and address the spinal stenosis 2. Posterior lumbar interbody fusion L3-4 using porous titanium interbody cages packed with morcellized allograft and autograft 3. Posterior fixation L3-4 using Alphatec cortical pedicle screws.  4. Intertransverse arthrodesis L3-4 using morcellized autograft and allograft.  SURGEON:  Sherley Bounds, MD  ASSISTANTSMarcellus Scott MD  ANESTHESIA:  General  EBL: 200 ml  Total I/O In: 1000 [I.V.:1000] Out: 435 [Urine:235; Blood:200]  BLOOD ADMINISTERED:none  DRAINS: none   INDICATION FOR PROCEDURE: This patient presented with severe back and leg pain. 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 The patient tried a reasonable attempt at conservative medical measures without relief. I recommended decompression and instrumented fusion to address the stenosis as well as the segmental  instability.  Patient understood the risks, benefits, and alternatives and potential outcomes and wished to proceed.  PROCEDURE DETAILS:  The patient was brought to the operating room. After induction of generalized endotracheal anesthesia the patient was rolled into the prone position on chest rolls and all pressure points were padded. The patient's lumbar region was cleaned and then prepped with DuraPrep and draped in the usual sterile fashion. Anesthesia was injected and then a dorsal midline incision was made and carried down to the lumbosacral fascia. The fascia was opened and the paraspinous musculature was taken down in a subperiosteal fashion to expose L3-4. A self-retaining  retractor was placed. Intraoperative fluoroscopy confirmed my level, and I started with placement of the L3 cortical pedicle screws. The pedicle screw entry zones were identified utilizing surface landmarks and  AP and lateral fluoroscopy. I scored the cortex with the high-speed drill and then used the hand drill to drill an upward and outward direction into the pedicle. I then tapped line to line. I then placed a 5.5 x 35 mm cortical pedicle screw into the pedicles of l3 bilaterally. I then turned my attention to the decompression and complete lumbar laminectomies, hemi- facetectomies, and foraminotomies were performed at l3-4. The patient had significant spinal stenosis and this required more work than would be required for a simple exposure of the disc for posterior lumbar interbody fusion which would only require a limited laminotomy. Much more generous decompression and generous foraminotomy was undertaken in order to adequately decompress the neural elements and address the patient's leg pain. The yellow ligament was removed to expose the underlying dura and nerve roots, and generous foraminotomies were performed to adequately decompress the neural elements. Both the exiting and traversing nerve roots were decompressed on both sides until a coronary dilator passed easily along the nerve roots. Once the decompression was complete, I turned my attention to the posterior lower lumbar interbody fusion. The epidural venous vasculature was coagulated and cut sharply. Disc space was incised and the initial discectomy was performed with pituitary rongeurs. The disc space was distracted with sequential distractors to a height of 10 mm. We then used a series of scrapers and shavers to prepare the endplates for fusion. The midline was prepared with Epstein curettes. Once the complete discectomy was finished, we packed an appropriate sized interbody cage with local autograft and morcellized allograft, gently retracted the  nerve  root, and tapped the cage into position at L3-4.  The midline between the cages was packed with morselized autograft and allograft. We then turned our attention to the placement of the lower pedicle screws. The pedicle screw entry zones were identified utilizing surface landmarks and fluoroscopy. I drilled into each pedicle utilizing the hand drill, and tapped each pedicle with the appropriate tap. We palpated with a ball probe to assure no break in the cortex. We then placed 5.5 x 35 mm cortical pedicle screws into the pedicles bilaterally at l4. We then decorticated the transverse processes and laid a mixture of morcellized autograft and allograft out over these to perform intertransverse arthrodesis at L3-4. We then placed lordotic rods into the multiaxial screw heads of the pedicle screws and locked these in position with the locking caps and anti-torque device. We then checked our construct with AP and lateral fluoroscopy. Irrigated with copious amounts of bacitracin-containing saline solution. Inspected the nerve roots once again to assure adequate decompression, lined to the dura with Gelfoam, placed powdered vancomycin into the wound, and closed the muscle and the fascia with 0 Vicryl. Closed the subcutaneous tissues with 2-0 Vicryl and subcuticular tissues with 3-0 Vicryl. The skin was closed with benzoin and Steri-Strips. Dressing was then applied, the patient was awakened from general anesthesia and transported to the recovery room in stable condition. At the end of the procedure all sponge, needle and instrument counts were correct.   PLAN OF CARE: admit to inpatient  PATIENT DISPOSITION:  PACU - hemodynamically stable.   Delay start of Pharmacological VTE agent (>24hrs) due to surgical blood loss or risk of bleeding:  yes

## 2017-11-20 NOTE — Transfer of Care (Signed)
Immediate Anesthesia Transfer of Care Note  Patient: Kristy Mendez  Procedure(s) Performed: Posterior Lumbar Interbody Fusion - Lumbar three-Lumbar four (N/A Back)  Patient Location: PACU  Anesthesia Type:General  Level of Consciousness: awake, alert  and oriented  Airway & Oxygen Therapy: Patient Spontanous Breathing and Patient connected to nasal cannula oxygen  Post-op Assessment: Report given to RN and Post -op Vital signs reviewed and stable  Post vital signs: Reviewed and stable  Last Vitals:  Vitals:   11/20/17 1131 11/20/17 1553  BP: 121/65   Pulse: 82   Resp: 18   Temp: 36.8 C (!) 36.4 C  SpO2: 100%     Last Pain:  Vitals:   11/20/17 1553  TempSrc:   PainSc: 10-Worst pain ever      Patients Stated Pain Goal: 3 (34/91/79 1505)  Complications: No apparent anesthesia complications

## 2017-11-21 MED ORDER — OXYCODONE HCL 5 MG PO TABS
10.0000 mg | ORAL_TABLET | ORAL | Status: DC | PRN
  Administered 2017-11-21 (×2): 10 mg via ORAL
  Filled 2017-11-21 (×2): qty 2

## 2017-11-21 MED ORDER — METHOCARBAMOL 500 MG PO TABS: 500.0000 mg | ORAL_TABLET | Freq: Four times a day (QID) | ORAL | 0 refills | Status: AC | PRN

## 2017-11-21 MED ORDER — OXYCODONE HCL 10 MG PO TABS: 10.0000 mg | ORAL_TABLET | ORAL | 0 refills | Status: AC | PRN

## 2017-11-21 MED ORDER — HYDROMORPHONE HCL 2 MG PO TABS
2.0000 mg | ORAL_TABLET | ORAL | Status: DC | PRN
  Administered 2017-11-21 (×2): 2 mg via ORAL
  Filled 2017-11-21 (×2): qty 1

## 2017-11-21 MED ORDER — METHOCARBAMOL 500 MG PO TABS: 500.0000 mg | ORAL_TABLET | Freq: Four times a day (QID) | ORAL | 0 refills | Status: DC | PRN

## 2017-11-21 MED ORDER — HYDROMORPHONE HCL 1 MG/ML IJ SOLN: 0.5000 mg | INTRAMUSCULAR | Status: DC | PRN

## 2017-11-21 MED ORDER — OXYCODONE-ACETAMINOPHEN 5-325 MG PO TABS: 1.0000 | ORAL_TABLET | Freq: Four times a day (QID) | ORAL | 0 refills | Status: AC | PRN

## 2017-11-21 MED FILL — Thrombin For Soln 5000 Unit: CUTANEOUS | Qty: 5000 | Status: AC

## 2017-11-21 NOTE — Progress Notes (Signed)
Patient alert and oriented, mae's well, voiding adequate amount of urine, swallowing without difficulty, c/o pain at time of discharge and medication given earlier with ice pack. Patient discharged home with family. Script and discharged instructions given to patient. Patient and family stated understanding of instructions given. Patient has an appointment with Dr. Ronnald Ramp

## 2017-11-21 NOTE — Evaluation (Signed)
Physical Therapy Evaluation Patient Details Name: Kristy Mendez MRN: [id number] DOB: December 08, 1975 Today's Date: 11/21/2017   History of Present Illness  Pt is a 42 y/o female s/p PLIF L3-L4. PMH including but not limited to multiple lumbar surgeries (1998, 2002 and 2011).  Clinical Impression  Pt presented supine in bed with HOB elevated, awake and willing to participate in therapy session. Pt's spouse present throughout session as well. Pt lives with her husband and several adult children in a single level home with a few steps to enter. Pt will have 24/7 supervision/assistance upon d/c. Pt with significant pain and very tearful throughout session. She tolerated bed mobility with min guard for safety and cueing for technique, transfers with supervision and ambulated in hallway with use of RW and supervision. Pt continues to demonstrate strength deficits throughout L LE which she reports were present prior to this surgery. Pt completed stair training this session as well with spouse present. PT provided pt with back precautions handout and reviewed with pt throughout. Pt would continue to benefit from skilled physical therapy services at this time while admitted and after d/c to address the below listed limitations in order to improve overall safety and independence with functional mobility.     Follow Up Recommendations Home health PT;Supervision/Assistance - 24 hour    Equipment Recommendations  Rolling walker with 5" wheels;3in1 (PT)    Recommendations for Other Services       Precautions / Restrictions Precautions Precautions: Back Precaution Booklet Issued: Yes (comment) Precaution Comments: Reviewed 3/3 back precautions with pt and pt's husband throughout session Required Braces or Orthoses: Spinal Brace Spinal Brace: Lumbar corset Restrictions Weight Bearing Restrictions: No      Mobility  Bed Mobility Overal bed mobility: Needs Assistance Bed Mobility: Rolling Rolling: Min  guard         General bed mobility comments: despite cueing and max encouragement to perform log roll technique, pt preferring to roll completely OOB from supine position towards her L side to end up in a standing position facing the bed  Transfers Overall transfer level: Needs assistance Equipment used: None;Rolling walker (2 wheeled) Transfers: Sit to/from Stand Sit to Stand: Supervision         General transfer comment: increased time and effort secondary to pain; cueing throughout to maintain back precautions  Ambulation/Gait Ambulation/Gait assistance: Supervision Ambulation Distance (Feet): 300 Feet Assistive device: Rolling walker (2 wheeled) Gait Pattern/deviations: Step-to pattern;Step-through pattern;Decreased step length - right;Decreased step length - left;Decreased stride length;Decreased dorsiflexion - left;Steppage(intermittent L foot drag; steppage gait on L) Gait velocity: decreased Gait velocity interpretation: Below normal speed for age/gender General Gait Details: pt required multiple standing rest breaks secondary to pain; no instability or LOB, supervision for safety  Stairs Stairs: Yes Stairs assistance: Min guard Stair Management: One rail Left;Step to pattern;Forwards;Sideways Number of Stairs: 2 General stair comments: cueing to ascend with R LE leading and descend with L LE leading, min guard for safety; pt's husband present throughout training as well  Wheelchair Mobility    Modified Rankin (Stroke Patients Only)       Balance Overall balance assessment: Needs assistance Sitting-balance support: Feet supported Sitting balance-Leahy Scale: Fair     Standing balance support: During functional activity;Single extremity supported Standing balance-Leahy Scale: Poor                               Pertinent Vitals/Pain Pain Assessment: Faces Faces Pain Scale:  Hurts worst Pain Location: back Pain Descriptors / Indicators:  Sore;Grimacing;Guarding;Moaning;Crying Pain Intervention(s): Monitored during session;Repositioned;Premedicated before session    Home Living Family/patient expects to be discharged to:: Private residence Living Arrangements: Spouse/significant other;Children Available Help at Discharge: Family;Available 24 hours/day Type of Home: House Home Access: Stairs to enter Entrance Stairs-Rails: Psychiatric nurse of Steps: 4 Home Layout: One level Home Equipment: Walker - standard;Toilet riser      Prior Function Level of Independence: Independent               Hand Dominance        Extremity/Trunk Assessment   Upper Extremity Assessment Upper Extremity Assessment: Defer to OT evaluation    Lower Extremity Assessment Lower Extremity Assessment: LLE deficits/detail LLE Deficits / Details: pt with apparent functional weakness of L LE with gait; pt with L foot drag and/or use of hip hike on L to clear foot with swing phase. Pt with 2/5 strength with ankle DF and hip flexion LLE: Unable to fully assess due to pain LLE Sensation: decreased light touch    Cervical / Trunk Assessment Cervical / Trunk Assessment: Other exceptions Cervical / Trunk Exceptions: s/p lumbar sx  Communication   Communication: No difficulties  Cognition Arousal/Alertness: Awake/alert Behavior During Therapy: WFL for tasks assessed/performed Overall Cognitive Status: Within Functional Limits for tasks assessed                                        General Comments      Exercises     Assessment/Plan    PT Assessment Patient needs continued PT services  PT Problem List Decreased strength;Decreased activity tolerance;Decreased balance;Decreased mobility;Decreased coordination;Decreased knowledge of use of DME;Decreased safety awareness;Decreased knowledge of precautions;Pain       PT Treatment Interventions DME instruction;Gait training;Stair training;Functional  mobility training;Therapeutic activities;Therapeutic exercise;Balance training;Neuromuscular re-education;Patient/family education    PT Goals (Current goals can be found in the Care Plan section)  Acute Rehab PT Goals Patient Stated Goal: decrease pain, return home PT Goal Formulation: With patient/family Time For Goal Achievement: 12/05/17 Potential to Achieve Goals: Good    Frequency Min 5X/week   Barriers to discharge        Co-evaluation               AM-PAC PT "6 Clicks" Daily Activity  Outcome Measure Difficulty turning over in bed (including adjusting bedclothes, sheets and blankets)?: A Little Difficulty moving from lying on back to sitting on the side of the bed? : A Little Difficulty sitting down on and standing up from a chair with arms (e.g., wheelchair, bedside commode, etc,.)?: A Little Help needed moving to and from a bed to chair (including a wheelchair)?: None Help needed walking in hospital room?: None Help needed climbing 3-5 steps with a railing? : A Little 6 Click Score: 20    End of Session Equipment Utilized During Treatment: Back brace Activity Tolerance: Patient limited by pain Patient left: in bed;with call bell/phone within reach;with family/visitor present Nurse Communication: Mobility status PT Visit Diagnosis: Other abnormalities of gait and mobility (R26.89);Pain Pain - part of body: (back)    Time: 5361-4431 PT Time Calculation (min) (ACUTE ONLY): 27 min   Charges:   PT Evaluation $PT Eval Low Complexity: 1 Low PT Treatments $Gait Training: 8-22 mins   PT G Codes:        Sherie Don, PT, [place] [phone]  [employer] 11/21/2017, 10:23 AM

## 2017-11-21 NOTE — Anesthesia Postprocedure Evaluation (Signed)
Anesthesia Post Note  Patient: Kristy Mendez  Procedure(s) Performed: Posterior Lumbar Interbody Fusion - Lumbar three-Lumbar four (N/A Back)     Patient location during evaluation: PACU Anesthesia Type: General Level of consciousness: awake and alert Pain management: pain level controlled Vital Signs Assessment: post-procedure vital signs reviewed and stable Respiratory status: spontaneous breathing, nonlabored ventilation, respiratory function stable and patient connected to nasal cannula oxygen Cardiovascular status: blood pressure returned to baseline and stable Postop Assessment: no apparent nausea or vomiting Anesthetic complications: no    Last Vitals:  Vitals:   11/21/17 0315 11/21/17 0818  BP: (!) 126/97 (!) 97/56  Pulse: 62 71  Resp: 16 16  Temp: (!) 36.3 C 37.1 C  SpO2: 99% 100%    Last Pain:  Vitals:   11/21/17 0528  TempSrc:   PainSc: Asleep                 Izzabella Besse EDWARD

## 2017-11-21 NOTE — Discharge Summary (Signed)
Physician Discharge Summary  Patient ID: Kristy Mendez MRN: [id number] DOB/AGE: May 08, 1976 42 y.o.  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 The patient tolerated the procedure well and was taken to the recovery room and then to the floor in stable condition. The hospital course was routine. There were no complications. The wound remained clean dry and intact. Pt had appropriate back soreness. No complaints of new pain or new N/T/W. The patient remained afebrile with stable vital signs, and tolerated a regular diet. The patient continued to increase activities, and pain was well controlled with oral pain medications.   Consults: None  Significant Diagnostic Studies:  Results for orders placed or performed during the hospital encounter of 11/17/17  Surgical pcr screen  Result Value Ref Range   MRSA, PCR NEGATIVE NEGATIVE   Staphylococcus aureus NEGATIVE NEGATIVE  Basic metabolic panel  Result Value Ref Range   Sodium 140 135 - 145 mmol/L   Potassium 3.9 3.5 - 5.1 mmol/L   Chloride 105 101 - 111 mmol/L   CO2 24 22 - 32 mmol/L   Glucose, Bld 91 65 - 99 mg/dL   BUN 9 6 - 20 mg/dL   Creatinine, Ser 0.86 0.44 - 1.00 mg/dL   Calcium 9.7 8.9 - 10.3 mg/dL   GFR calc non Af Amer >60 >60 mL/min   GFR calc Af Amer >60 >60 mL/min   Anion gap 11 5 - 15  CBC WITH DIFFERENTIAL  Result Value Ref Range   WBC 8.7 4.0 - 10.5 K/uL   RBC 4.33 3.87 - 5.11 MIL/uL   Hemoglobin 13.6 12.0 - 15.0 g/dL   HCT 40.6 36.0 - 46.0 %   MCV 93.8 78.0 - 100.0 fL   MCH 31.4 26.0 - 34.0 pg   MCHC 33.5 30.0 - 36.0 g/dL   RDW 13.3 11.5 - 15.5 %   Platelets 309 150 - 400 K/uL   Neutrophils Relative % 65 %   Neutro Abs 5.7 1.7 - 7.7 K/uL   Lymphocytes Relative 27 %   Lymphs Abs 2.4 0.7 - 4.0 K/uL   Monocytes Relative 6 %   Monocytes Absolute 0.5 0.1 - 1.0 K/uL   Eosinophils Relative 1 %   Eosinophils Absolute 0.1 0.0 - 0.7 K/uL   Basophils Relative 1 %   Basophils Absolute 0.1 0.0 - 0.1 K/uL  Protime-INR  Result Value Ref Range   Prothrombin Time 15.0 11.4 - 15.2 seconds   INR 1.19   hCG, serum, qualitative  Result Value Ref Range   Preg, Serum NEGATIVE NEGATIVE  Type and screen All Cardiac and thoracic surgeries, spinal fusions, myomectomies, craniotomies, colon & liver resections, total joint revisions, same day c-section with placenta previa or accreta.  Result Value Ref Range   ABO/RH(D) O POS    Antibody Screen NEG    Sample Expiration 12/01/2017    Extend sample reason NO TRANSFUSIONS OR PREGNANCY IN THE PAST 3 MONTHS     Chest 2 View  Result Date: 11/17/2017 CLINICAL DATA:  Pre op for 1/31 for lumbar surgery, past smoker - quit 2 months ago, pt shielded EXAM: CHEST  2 VIEW COMPARISON:  None. FINDINGS: The heart size and mediastinal contours are within normal limits. Both lungs are clear. The visualized skeletal  structures are unremarkable. IMPRESSION: No active cardiopulmonary disease. Electronically Signed   By: Franki Cabot M.D.   On: 11/17/2017 21:42   Dg Lumbar Spine 2-3 Views  Result Date: 11/20/2017 CLINICAL DATA:  PLIF L3-4 EXAM: DG C-ARM 61-120 MIN; LUMBAR SPINE - 2-3 VIEW COMPARISON:  05/20/2017 FINDINGS: Compared to the prior study, patient has had interval discectomy at L3-4 with placement of bilateral pedicle screws at the L3 and L4 levels. Previous fusion hardware at L4-5 appears similar to the preoperative exam. IMPRESSION: Intraoperative evaluation during PLIF at L3-4. Electronically Signed   By: Misty Stanley M.D.   On: 11/20/2017 15:42   Dg C-arm 1-60 Min  Result Date: 11/20/2017 CLINICAL DATA:  PLIF L3-4 EXAM: DG C-ARM 61-120 MIN; LUMBAR SPINE - 2-3 VIEW COMPARISON:  05/20/2017 FINDINGS:  Compared to the prior study, patient has had interval discectomy at L3-4 with placement of bilateral pedicle screws at the L3 and L4 levels. Previous fusion hardware at L4-5 appears similar to the preoperative exam. IMPRESSION: Intraoperative evaluation during PLIF at L3-4. Electronically Signed   By: Misty Stanley M.D.   On: 11/20/2017 15:42    Antibiotics:  Anti-infectives (From admission, onward)   Start     Dose/Rate Route Frequency Ordered Stop   11/20/17 2100  ceFAZolin (ANCEF) IVPB 2g/100 mL premix     2 g 200 mL/hr over 30 Minutes Intravenous Every 8 hours 11/20/17 1827 11/21/17 0414   11/20/17 1540  vancomycin (VANCOCIN) powder  Status:  Discontinued       As needed 11/20/17 1542 11/20/17 1549   11/20/17 1411  bacitracin 50,000 Units in sodium chloride irrigation 0.9 % 500 mL irrigation  Status:  Discontinued       As needed 11/20/17 1412 11/20/17 1549   11/20/17 1041  ceFAZolin (ANCEF) IVPB 2g/100 mL premix     2 g 200 mL/hr over 30 Minutes Intravenous On call to O.R. 11/20/17 1041 11/20/17 1345      Discharge Exam: Blood pressure (!) 126/97, pulse 62, temperature (!) 97.3 F (36.3 C), temperature source Oral, resp. rate 16, height 5' 7.75" (1.721 m), weight 66.8 kg (147 lb 3.2 oz), SpO2 99 %. Neurologic: Grossly normal Ambulating and voiding well  Discharge Medications:   Allergies as of 11/21/2017   No Known Allergies     Medication List    TAKE these medications   acetaminophen 500 MG tablet Commonly known as:  TYLENOL Take 1,000 mg by mouth every 6 (six) hours as needed for moderate pain.   ibuprofen 200 MG tablet Commonly known as:  ADVIL,MOTRIN Take 600 mg by mouth 2 (two) times daily as needed for headache or moderate pain.   levonorgestrel 20 MCG/24HR IUD Commonly known as:  MIRENA 1 each by Intrauterine route once.   methocarbamol 500 MG tablet Commonly known as:  ROBAXIN Take 1 tablet (500 mg total) by mouth every 6 (six) hours as needed for muscle  spasms.   oxyCODONE-acetaminophen 5-325 MG tablet Commonly known as:  PERCOCET/ROXICET Take 1 tablet by mouth every 6 (six) hours as needed for severe pain. What changed:  when to take this            Durable Medical Equipment  (From admission, onward)        Start     Ordered   11/20/17 1828  DME Walker rolling  Once    Question:  Patient needs a walker to treat with the following condition  Answer:  S/P lumbar fusion  11/20/17 1827   11/20/17 1828  DME 3 n 1  Once     11/20/17 1827      Disposition: home   Final Dx: same  Discharge Instructions     Remove dressing in 72 hours   Complete by:  As directed    Call MD for:  difficulty breathing, headache or visual disturbances   Complete by:  As directed    Call MD for:  extreme fatigue   Complete by:  As directed    Call MD for:  hives   Complete by:  As directed    Call MD for:  persistant dizziness or light-headedness   Complete by:  As directed    Call MD for:  persistant nausea and vomiting   Complete by:  As directed    Call MD for:  redness, tenderness, or signs of infection (pain, swelling, redness, odor or green/yellow discharge around incision site)   Complete by:  As directed    Call MD for:  severe uncontrolled pain   Complete by:  As directed    Call MD for:  temperature >100.4   Complete by:  As directed    Diet - low sodium heart healthy   Complete by:  As directed    Driving Restrictions   Complete by:  As directed    No driving 2 weeks   Increase activity slowly   Complete by:  As directed    Lifting restrictions   Complete by:  As directed    Nothing heavier than 8 lbs         Signed: Ocie Cornfield Salar Molden 11/21/2017, 7:50 AM

## 2017-11-21 NOTE — Evaluation (Signed)
Occupational Therapy Evaluation Patient Details Name: Kristy Mendez MRN: [id number] DOB: 03-19-1976 Today's Date: 11/21/2017    History of Present Illness Pt is a 42 y/o female s/p PLIF L3-L4. PMH including but not limited to multiple lumbar surgeries (1998, 2002 and 2011).   Clinical Impression   OT eval limited by pain. Pt reports she was independent with ADL PTA. Provided all back, safety, and ADL education to pt and husband. Observed pt mobility in hallway with PT; pt required supervision with use of RW. Pt planning to d/c home with 24/7 supervision from family. Pt would benefit from continued skilled OT to address established goals.    Follow Up Recommendations  No OT follow up;Supervision/Assistance - 24 hour    Equipment Recommendations  3 in 1 bedside commode    Recommendations for Other Services       Precautions / Restrictions Precautions Precautions: Back Precaution Booklet Issued: No Precaution Comments: Able to recall 2/3 precautions; reviewed all with pt and husband Required Braces or Orthoses: Spinal Brace Spinal Brace: Lumbar corset Restrictions Weight Bearing Restrictions: No      Mobility Bed Mobility   Transfers     Balance                               ADL either performed or assessed with clinical judgement   ADL Overall ADL's : Needs assistance/impaired                                       General ADL Comments: Educated pt on maintaining back precautions during functional activities, keeping frequently used items at counter top height, log roll for bed mobility, frequent mobility throughout the day upon return home, use of 2 cups for oral care, compensatory strategies for LB ADL, and proper technique for peri care without twisting. Pt just returned to bed following PT eval and declines movement at this time. Pt observed to walk in hallway with PT with supervision and use of RW.     Vision         Perception      Praxis      Pertinent Vitals/Pain Pain Assessment: Faces Faces Pain Scale: Hurts worst Pain Location: back, L hip Pain Descriptors / Indicators: Aching;Discomfort;Grimacing;Guarding;Sharp Pain Intervention(s): Monitored during session;Limited activity within patient's tolerance;Premedicated before session;Ice applied     Hand Dominance     Extremity/Trunk Assessment Upper Extremity Assessment Upper Extremity Assessment: Overall WFL for tasks assessed   Lower Extremity Assessment Lower Extremity Assessment: Defer to PT evaluation    Cervical / Trunk Assessment Cervical / Trunk Assessment: Other exceptions Cervical / Trunk Exceptions: s/p lumbar sx   Communication Communication Communication: No difficulties   Cognition Arousal/Alertness: Awake/alert Behavior During Therapy: WFL for tasks assessed/performed Overall Cognitive Status: Within Functional Limits for tasks assessed                                     General Comments       Exercises     Shoulder Instructions      Home Living Family/patient expects to be discharged to:: Private residence Living Arrangements: Spouse/significant other;Children Available Help at Discharge: Family;Available 24 hours/day Type of Home: House Home Access: Stairs to enter [employer] of Steps: 4 Entrance Stairs-Rails:  Right;Left Home Layout: One level     Bathroom Shower/Tub: Occupational psychologist: Standard     Home Equipment: Walker - standard;Toilet riser          Prior Functioning/Environment Level of Independence: Independent                 OT Problem List: Decreased activity tolerance;Impaired balance (sitting and/or standing);Decreased knowledge of use of DME or AE;Decreased knowledge of precautions;Pain      OT Treatment/Interventions: Self-care/ADL training;DME and/or AE instruction;Therapeutic activities;Patient/family education;Balance training    OT  Goals(Current goals can be found in the care plan section) Acute Rehab OT Goals Patient Stated Goal: decrease pain, return home OT Goal Formulation: With patient/family Time For Goal Achievement: 12/05/17 Potential to Achieve Goals: Good ADL Goals Pt Will Perform Lower Body Bathing: sit to/from stand;with supervision Pt Will Perform Lower Body Dressing: with supervision;sit to/from stand Pt Will Transfer to Toilet: with supervision;ambulating;bedside commode Pt Will Perform Toileting - Clothing Manipulation and hygiene: with supervision;sit to/from stand Pt Will Perform Tub/Shower Transfer: Shower transfer;with supervision;ambulating;3 in 1;rolling walker Additional ADL Goal #1: Pt will independently verbally recall 3/3 back precautions and maintain throughout ADL. Additional ADL Goal #2: Pt will don/doff back brace with set up as precursor to functional mobility.  OT Frequency: Min 2X/week   Barriers to D/C:            Co-evaluation              AM-PAC PT "6 Clicks" Daily Activity     Outcome Measure Help from another person eating meals?: None Help from another person taking care of personal grooming?: A Little Help from another person toileting, which includes using toliet, bedpan, or urinal?: A Lot Help from another person bathing (including washing, rinsing, drying)?: A Lot Help from another person to put on and taking off regular upper body clothing?: A Little Help from another person to put on and taking off regular lower body clothing?: A Lot 6 Click Score: 16   End of Session Nurse Communication: Other (comment)(pt asking about what pain medicine she has received)  Activity Tolerance: Patient limited by pain Patient left: in bed;with call bell/phone within reach;with family/visitor present  OT Visit Diagnosis: Unsteadiness on feet (R26.81);Pain Pain - part of body: (back)                Time: 1219-7588 OT Time Calculation (min): 12 min Charges:  OT General  Charges $OT Visit: 1 Visit OT Evaluation $OT Eval Moderate Complexity: 1 Mod G-Codes:     Brendaly Townsel A. Ulice Brilliant, M.S., OTR/L Pager: [place] 11/21/2017, 1:12 PM

## 2018-01-09 DIAGNOSIS — M4316 Spondylolisthesis, lumbar region: Secondary | ICD-10-CM | POA: Diagnosis not present

## 2018-01-15 DIAGNOSIS — M546 Pain in thoracic spine: Secondary | ICD-10-CM | POA: Diagnosis not present

## 2018-03-10 DIAGNOSIS — R030 Elevated blood-pressure reading, without diagnosis of hypertension: Secondary | ICD-10-CM | POA: Diagnosis not present

## 2018-03-10 DIAGNOSIS — M4316 Spondylolisthesis, lumbar region: Secondary | ICD-10-CM | POA: Diagnosis not present

## 2018-04-03 DIAGNOSIS — M531 Cervicobrachial syndrome: Secondary | ICD-10-CM | POA: Diagnosis not present

## 2018-04-03 DIAGNOSIS — R262 Difficulty in walking, not elsewhere classified: Secondary | ICD-10-CM | POA: Diagnosis not present

## 2018-04-03 DIAGNOSIS — M5417 Radiculopathy, lumbosacral region: Secondary | ICD-10-CM | POA: Diagnosis not present

## 2018-04-06 DIAGNOSIS — M531 Cervicobrachial syndrome: Secondary | ICD-10-CM | POA: Diagnosis not present

## 2018-04-06 DIAGNOSIS — R262 Difficulty in walking, not elsewhere classified: Secondary | ICD-10-CM | POA: Diagnosis not present

## 2018-04-06 DIAGNOSIS — M5417 Radiculopathy, lumbosacral region: Secondary | ICD-10-CM | POA: Diagnosis not present

## 2018-04-09 DIAGNOSIS — R262 Difficulty in walking, not elsewhere classified: Secondary | ICD-10-CM | POA: Diagnosis not present

## 2018-04-09 DIAGNOSIS — M531 Cervicobrachial syndrome: Secondary | ICD-10-CM | POA: Diagnosis not present

## 2018-04-09 DIAGNOSIS — M5417 Radiculopathy, lumbosacral region: Secondary | ICD-10-CM | POA: Diagnosis not present

## 2018-04-14 DIAGNOSIS — R262 Difficulty in walking, not elsewhere classified: Secondary | ICD-10-CM | POA: Diagnosis not present

## 2018-04-14 DIAGNOSIS — M531 Cervicobrachial syndrome: Secondary | ICD-10-CM | POA: Diagnosis not present

## 2018-04-14 DIAGNOSIS — M5417 Radiculopathy, lumbosacral region: Secondary | ICD-10-CM | POA: Diagnosis not present

## 2018-04-17 DIAGNOSIS — M5417 Radiculopathy, lumbosacral region: Secondary | ICD-10-CM | POA: Diagnosis not present

## 2018-04-17 DIAGNOSIS — R262 Difficulty in walking, not elsewhere classified: Secondary | ICD-10-CM | POA: Diagnosis not present

## 2018-04-17 DIAGNOSIS — M531 Cervicobrachial syndrome: Secondary | ICD-10-CM | POA: Diagnosis not present

## 2018-04-20 DIAGNOSIS — M531 Cervicobrachial syndrome: Secondary | ICD-10-CM | POA: Diagnosis not present

## 2018-04-20 DIAGNOSIS — R262 Difficulty in walking, not elsewhere classified: Secondary | ICD-10-CM | POA: Diagnosis not present

## 2018-04-20 DIAGNOSIS — M5417 Radiculopathy, lumbosacral region: Secondary | ICD-10-CM | POA: Diagnosis not present

## 2018-04-22 DIAGNOSIS — M531 Cervicobrachial syndrome: Secondary | ICD-10-CM | POA: Diagnosis not present

## 2018-04-22 DIAGNOSIS — R262 Difficulty in walking, not elsewhere classified: Secondary | ICD-10-CM | POA: Diagnosis not present

## 2018-04-22 DIAGNOSIS — M5417 Radiculopathy, lumbosacral region: Secondary | ICD-10-CM | POA: Diagnosis not present

## 2018-04-27 DIAGNOSIS — M531 Cervicobrachial syndrome: Secondary | ICD-10-CM | POA: Diagnosis not present

## 2018-04-27 DIAGNOSIS — M5417 Radiculopathy, lumbosacral region: Secondary | ICD-10-CM | POA: Diagnosis not present

## 2018-04-27 DIAGNOSIS — R262 Difficulty in walking, not elsewhere classified: Secondary | ICD-10-CM | POA: Diagnosis not present

## 2018-04-29 DIAGNOSIS — M531 Cervicobrachial syndrome: Secondary | ICD-10-CM | POA: Diagnosis not present

## 2018-04-29 DIAGNOSIS — M5417 Radiculopathy, lumbosacral region: Secondary | ICD-10-CM | POA: Diagnosis not present

## 2018-04-29 DIAGNOSIS — R262 Difficulty in walking, not elsewhere classified: Secondary | ICD-10-CM | POA: Diagnosis not present

## 2018-05-01 DIAGNOSIS — M531 Cervicobrachial syndrome: Secondary | ICD-10-CM | POA: Diagnosis not present

## 2018-05-01 DIAGNOSIS — R262 Difficulty in walking, not elsewhere classified: Secondary | ICD-10-CM | POA: Diagnosis not present

## 2018-05-01 DIAGNOSIS — M5417 Radiculopathy, lumbosacral region: Secondary | ICD-10-CM | POA: Diagnosis not present

## 2018-05-04 DIAGNOSIS — M531 Cervicobrachial syndrome: Secondary | ICD-10-CM | POA: Diagnosis not present

## 2018-05-04 DIAGNOSIS — M5417 Radiculopathy, lumbosacral region: Secondary | ICD-10-CM | POA: Diagnosis not present

## 2018-05-04 DIAGNOSIS — R262 Difficulty in walking, not elsewhere classified: Secondary | ICD-10-CM | POA: Diagnosis not present

## 2018-05-06 DIAGNOSIS — M531 Cervicobrachial syndrome: Secondary | ICD-10-CM | POA: Diagnosis not present

## 2018-05-06 DIAGNOSIS — M5417 Radiculopathy, lumbosacral region: Secondary | ICD-10-CM | POA: Diagnosis not present

## 2018-05-06 DIAGNOSIS — R262 Difficulty in walking, not elsewhere classified: Secondary | ICD-10-CM | POA: Diagnosis not present

## 2018-05-11 DIAGNOSIS — M5417 Radiculopathy, lumbosacral region: Secondary | ICD-10-CM | POA: Diagnosis not present

## 2018-05-11 DIAGNOSIS — R262 Difficulty in walking, not elsewhere classified: Secondary | ICD-10-CM | POA: Diagnosis not present

## 2018-05-11 DIAGNOSIS — M531 Cervicobrachial syndrome: Secondary | ICD-10-CM | POA: Diagnosis not present

## 2018-05-13 DIAGNOSIS — M5417 Radiculopathy, lumbosacral region: Secondary | ICD-10-CM | POA: Diagnosis not present

## 2018-05-13 DIAGNOSIS — R262 Difficulty in walking, not elsewhere classified: Secondary | ICD-10-CM | POA: Diagnosis not present

## 2018-05-13 DIAGNOSIS — M531 Cervicobrachial syndrome: Secondary | ICD-10-CM | POA: Diagnosis not present

## 2018-05-14 DIAGNOSIS — Z79899 Other long term (current) drug therapy: Secondary | ICD-10-CM | POA: Diagnosis not present

## 2018-05-14 DIAGNOSIS — M5124 Other intervertebral disc displacement, thoracic region: Secondary | ICD-10-CM | POA: Diagnosis not present

## 2018-05-18 DIAGNOSIS — M531 Cervicobrachial syndrome: Secondary | ICD-10-CM | POA: Diagnosis not present

## 2018-05-18 DIAGNOSIS — M5417 Radiculopathy, lumbosacral region: Secondary | ICD-10-CM | POA: Diagnosis not present

## 2018-05-18 DIAGNOSIS — R262 Difficulty in walking, not elsewhere classified: Secondary | ICD-10-CM | POA: Diagnosis not present

## 2018-05-20 DIAGNOSIS — M5417 Radiculopathy, lumbosacral region: Secondary | ICD-10-CM | POA: Diagnosis not present

## 2018-05-20 DIAGNOSIS — M531 Cervicobrachial syndrome: Secondary | ICD-10-CM | POA: Diagnosis not present

## 2018-05-20 DIAGNOSIS — R262 Difficulty in walking, not elsewhere classified: Secondary | ICD-10-CM | POA: Diagnosis not present

## 2018-05-26 DIAGNOSIS — R262 Difficulty in walking, not elsewhere classified: Secondary | ICD-10-CM | POA: Diagnosis not present

## 2018-05-26 DIAGNOSIS — M531 Cervicobrachial syndrome: Secondary | ICD-10-CM | POA: Diagnosis not present

## 2018-05-26 DIAGNOSIS — M5417 Radiculopathy, lumbosacral region: Secondary | ICD-10-CM | POA: Diagnosis not present

## 2018-06-02 DIAGNOSIS — M5417 Radiculopathy, lumbosacral region: Secondary | ICD-10-CM | POA: Diagnosis not present

## 2018-06-02 DIAGNOSIS — M531 Cervicobrachial syndrome: Secondary | ICD-10-CM | POA: Diagnosis not present

## 2018-06-02 DIAGNOSIS — R262 Difficulty in walking, not elsewhere classified: Secondary | ICD-10-CM | POA: Diagnosis not present

## 2018-06-05 DIAGNOSIS — M531 Cervicobrachial syndrome: Secondary | ICD-10-CM | POA: Diagnosis not present

## 2018-06-05 DIAGNOSIS — M5417 Radiculopathy, lumbosacral region: Secondary | ICD-10-CM | POA: Diagnosis not present

## 2018-06-05 DIAGNOSIS — R262 Difficulty in walking, not elsewhere classified: Secondary | ICD-10-CM | POA: Diagnosis not present

## 2018-06-08 DIAGNOSIS — M4316 Spondylolisthesis, lumbar region: Secondary | ICD-10-CM | POA: Diagnosis not present

## 2018-06-10 DIAGNOSIS — M5417 Radiculopathy, lumbosacral region: Secondary | ICD-10-CM | POA: Diagnosis not present

## 2018-06-10 DIAGNOSIS — R262 Difficulty in walking, not elsewhere classified: Secondary | ICD-10-CM | POA: Diagnosis not present

## 2018-06-10 DIAGNOSIS — M531 Cervicobrachial syndrome: Secondary | ICD-10-CM | POA: Diagnosis not present

## 2018-06-12 DIAGNOSIS — R262 Difficulty in walking, not elsewhere classified: Secondary | ICD-10-CM | POA: Diagnosis not present

## 2018-06-12 DIAGNOSIS — M531 Cervicobrachial syndrome: Secondary | ICD-10-CM | POA: Diagnosis not present

## 2018-06-12 DIAGNOSIS — M5417 Radiculopathy, lumbosacral region: Secondary | ICD-10-CM | POA: Diagnosis not present

## 2018-06-15 DIAGNOSIS — R262 Difficulty in walking, not elsewhere classified: Secondary | ICD-10-CM | POA: Diagnosis not present

## 2018-06-15 DIAGNOSIS — M5417 Radiculopathy, lumbosacral region: Secondary | ICD-10-CM | POA: Diagnosis not present

## 2018-06-15 DIAGNOSIS — M531 Cervicobrachial syndrome: Secondary | ICD-10-CM | POA: Diagnosis not present

## 2018-06-17 DIAGNOSIS — M5417 Radiculopathy, lumbosacral region: Secondary | ICD-10-CM | POA: Diagnosis not present

## 2018-06-17 DIAGNOSIS — R262 Difficulty in walking, not elsewhere classified: Secondary | ICD-10-CM | POA: Diagnosis not present

## 2018-06-17 DIAGNOSIS — M531 Cervicobrachial syndrome: Secondary | ICD-10-CM | POA: Diagnosis not present

## 2018-06-18 DIAGNOSIS — M5124 Other intervertebral disc displacement, thoracic region: Secondary | ICD-10-CM | POA: Diagnosis not present

## 2018-06-18 DIAGNOSIS — Z79899 Other long term (current) drug therapy: Secondary | ICD-10-CM | POA: Diagnosis not present

## 2018-06-24 DIAGNOSIS — R262 Difficulty in walking, not elsewhere classified: Secondary | ICD-10-CM | POA: Diagnosis not present

## 2018-06-24 DIAGNOSIS — M531 Cervicobrachial syndrome: Secondary | ICD-10-CM | POA: Diagnosis not present

## 2018-06-24 DIAGNOSIS — M5417 Radiculopathy, lumbosacral region: Secondary | ICD-10-CM | POA: Diagnosis not present

## 2018-06-26 DIAGNOSIS — R262 Difficulty in walking, not elsewhere classified: Secondary | ICD-10-CM | POA: Diagnosis not present

## 2018-06-26 DIAGNOSIS — M5417 Radiculopathy, lumbosacral region: Secondary | ICD-10-CM | POA: Diagnosis not present

## 2018-06-26 DIAGNOSIS — M531 Cervicobrachial syndrome: Secondary | ICD-10-CM | POA: Diagnosis not present

## 2018-06-29 DIAGNOSIS — M531 Cervicobrachial syndrome: Secondary | ICD-10-CM | POA: Diagnosis not present

## 2018-06-29 DIAGNOSIS — R262 Difficulty in walking, not elsewhere classified: Secondary | ICD-10-CM | POA: Diagnosis not present

## 2018-06-29 DIAGNOSIS — M5417 Radiculopathy, lumbosacral region: Secondary | ICD-10-CM | POA: Diagnosis not present

## 2018-07-02 DIAGNOSIS — M5124 Other intervertebral disc displacement, thoracic region: Secondary | ICD-10-CM | POA: Diagnosis not present

## 2018-07-02 DIAGNOSIS — Z79899 Other long term (current) drug therapy: Secondary | ICD-10-CM | POA: Diagnosis not present

## 2018-07-02 DIAGNOSIS — M961 Postlaminectomy syndrome, not elsewhere classified: Secondary | ICD-10-CM | POA: Diagnosis not present

## 2018-07-03 DIAGNOSIS — R262 Difficulty in walking, not elsewhere classified: Secondary | ICD-10-CM | POA: Diagnosis not present

## 2018-07-03 DIAGNOSIS — M5417 Radiculopathy, lumbosacral region: Secondary | ICD-10-CM | POA: Diagnosis not present

## 2018-07-03 DIAGNOSIS — M531 Cervicobrachial syndrome: Secondary | ICD-10-CM | POA: Diagnosis not present

## 2018-07-06 DIAGNOSIS — M531 Cervicobrachial syndrome: Secondary | ICD-10-CM | POA: Diagnosis not present

## 2018-07-06 DIAGNOSIS — R262 Difficulty in walking, not elsewhere classified: Secondary | ICD-10-CM | POA: Diagnosis not present

## 2018-07-06 DIAGNOSIS — M5417 Radiculopathy, lumbosacral region: Secondary | ICD-10-CM | POA: Diagnosis not present

## 2018-07-09 DIAGNOSIS — R262 Difficulty in walking, not elsewhere classified: Secondary | ICD-10-CM | POA: Diagnosis not present

## 2018-07-09 DIAGNOSIS — M5417 Radiculopathy, lumbosacral region: Secondary | ICD-10-CM | POA: Diagnosis not present

## 2018-07-09 DIAGNOSIS — M531 Cervicobrachial syndrome: Secondary | ICD-10-CM | POA: Diagnosis not present

## 2018-07-16 DIAGNOSIS — M5417 Radiculopathy, lumbosacral region: Secondary | ICD-10-CM | POA: Diagnosis not present

## 2018-07-16 DIAGNOSIS — R262 Difficulty in walking, not elsewhere classified: Secondary | ICD-10-CM | POA: Diagnosis not present

## 2018-07-16 DIAGNOSIS — M531 Cervicobrachial syndrome: Secondary | ICD-10-CM | POA: Diagnosis not present

## 2018-07-17 DIAGNOSIS — F419 Anxiety disorder, unspecified: Secondary | ICD-10-CM | POA: Diagnosis not present

## 2018-07-17 DIAGNOSIS — F39 Unspecified mood [affective] disorder: Secondary | ICD-10-CM | POA: Diagnosis not present

## 2018-07-17 DIAGNOSIS — F329 Major depressive disorder, single episode, unspecified: Secondary | ICD-10-CM | POA: Diagnosis not present

## 2018-07-17 DIAGNOSIS — F450 Somatization disorder: Secondary | ICD-10-CM | POA: Diagnosis not present

## 2018-07-30 DIAGNOSIS — Z79899 Other long term (current) drug therapy: Secondary | ICD-10-CM | POA: Diagnosis not present

## 2018-07-30 DIAGNOSIS — M533 Sacrococcygeal disorders, not elsewhere classified: Secondary | ICD-10-CM | POA: Diagnosis not present

## 2018-07-30 DIAGNOSIS — M961 Postlaminectomy syndrome, not elsewhere classified: Secondary | ICD-10-CM | POA: Diagnosis not present

## 2018-07-30 DIAGNOSIS — M5124 Other intervertebral disc displacement, thoracic region: Secondary | ICD-10-CM | POA: Diagnosis not present

## 2018-08-04 DIAGNOSIS — F39 Unspecified mood [affective] disorder: Secondary | ICD-10-CM | POA: Diagnosis not present

## 2018-08-04 DIAGNOSIS — F450 Somatization disorder: Secondary | ICD-10-CM | POA: Diagnosis not present

## 2018-08-04 DIAGNOSIS — F329 Major depressive disorder, single episode, unspecified: Secondary | ICD-10-CM | POA: Diagnosis not present

## 2018-08-04 DIAGNOSIS — F419 Anxiety disorder, unspecified: Secondary | ICD-10-CM | POA: Diagnosis not present

## 2018-08-27 DIAGNOSIS — Z79891 Long term (current) use of opiate analgesic: Secondary | ICD-10-CM | POA: Diagnosis not present

## 2018-08-27 DIAGNOSIS — M533 Sacrococcygeal disorders, not elsewhere classified: Secondary | ICD-10-CM | POA: Diagnosis not present

## 2018-08-27 DIAGNOSIS — Z5181 Encounter for therapeutic drug level monitoring: Secondary | ICD-10-CM | POA: Diagnosis not present

## 2018-08-27 DIAGNOSIS — M961 Postlaminectomy syndrome, not elsewhere classified: Secondary | ICD-10-CM | POA: Diagnosis not present

## 2018-08-27 DIAGNOSIS — M5442 Lumbago with sciatica, left side: Secondary | ICD-10-CM | POA: Diagnosis not present

## 2018-08-27 DIAGNOSIS — G8929 Other chronic pain: Secondary | ICD-10-CM | POA: Diagnosis not present

## 2018-09-08 DIAGNOSIS — M4316 Spondylolisthesis, lumbar region: Secondary | ICD-10-CM | POA: Diagnosis not present

## 2018-09-28 DIAGNOSIS — Z5181 Encounter for therapeutic drug level monitoring: Secondary | ICD-10-CM | POA: Diagnosis not present

## 2018-09-28 DIAGNOSIS — M5124 Other intervertebral disc displacement, thoracic region: Secondary | ICD-10-CM | POA: Diagnosis not present

## 2018-09-28 DIAGNOSIS — M5442 Lumbago with sciatica, left side: Secondary | ICD-10-CM | POA: Diagnosis not present

## 2018-09-28 DIAGNOSIS — M961 Postlaminectomy syndrome, not elsewhere classified: Secondary | ICD-10-CM | POA: Diagnosis not present

## 2018-09-28 DIAGNOSIS — G8929 Other chronic pain: Secondary | ICD-10-CM | POA: Diagnosis not present

## 2018-09-28 DIAGNOSIS — M533 Sacrococcygeal disorders, not elsewhere classified: Secondary | ICD-10-CM | POA: Diagnosis not present

## 2018-09-28 DIAGNOSIS — Z79899 Other long term (current) drug therapy: Secondary | ICD-10-CM | POA: Diagnosis not present

## 2018-09-28 DIAGNOSIS — Z79891 Long term (current) use of opiate analgesic: Secondary | ICD-10-CM | POA: Diagnosis not present

## 2018-09-28 DIAGNOSIS — F419 Anxiety disorder, unspecified: Secondary | ICD-10-CM | POA: Diagnosis not present

## 2018-10-08 DIAGNOSIS — M4698 Unspecified inflammatory spondylopathy, sacral and sacrococcygeal region: Secondary | ICD-10-CM | POA: Diagnosis not present

## 2018-10-08 DIAGNOSIS — G8929 Other chronic pain: Secondary | ICD-10-CM | POA: Diagnosis not present

## 2018-10-08 DIAGNOSIS — M533 Sacrococcygeal disorders, not elsewhere classified: Secondary | ICD-10-CM | POA: Diagnosis not present

## 2018-10-22 DIAGNOSIS — Z79899 Other long term (current) drug therapy: Secondary | ICD-10-CM | POA: Diagnosis not present

## 2018-10-22 DIAGNOSIS — M533 Sacrococcygeal disorders, not elsewhere classified: Secondary | ICD-10-CM | POA: Diagnosis not present

## 2018-10-22 DIAGNOSIS — M961 Postlaminectomy syndrome, not elsewhere classified: Secondary | ICD-10-CM | POA: Diagnosis not present

## 2018-10-22 DIAGNOSIS — G8929 Other chronic pain: Secondary | ICD-10-CM | POA: Diagnosis not present

## 2018-10-22 DIAGNOSIS — M5124 Other intervertebral disc displacement, thoracic region: Secondary | ICD-10-CM | POA: Diagnosis not present

## 2018-10-22 DIAGNOSIS — Z5181 Encounter for therapeutic drug level monitoring: Secondary | ICD-10-CM | POA: Diagnosis not present

## 2018-10-22 DIAGNOSIS — Z79891 Long term (current) use of opiate analgesic: Secondary | ICD-10-CM | POA: Diagnosis not present

## 2018-10-22 DIAGNOSIS — M5442 Lumbago with sciatica, left side: Secondary | ICD-10-CM | POA: Diagnosis not present

## 2018-10-30 DIAGNOSIS — M961 Postlaminectomy syndrome, not elsewhere classified: Secondary | ICD-10-CM | POA: Diagnosis not present

## 2018-10-30 DIAGNOSIS — G8929 Other chronic pain: Secondary | ICD-10-CM | POA: Diagnosis not present

## 2018-10-30 DIAGNOSIS — M533 Sacrococcygeal disorders, not elsewhere classified: Secondary | ICD-10-CM | POA: Diagnosis not present

## 2018-10-30 DIAGNOSIS — M5442 Lumbago with sciatica, left side: Secondary | ICD-10-CM | POA: Diagnosis not present

## 2018-11-05 DIAGNOSIS — M533 Sacrococcygeal disorders, not elsewhere classified: Secondary | ICD-10-CM | POA: Diagnosis not present

## 2018-11-05 DIAGNOSIS — M545 Low back pain: Secondary | ICD-10-CM | POA: Diagnosis not present

## 2018-11-05 DIAGNOSIS — G8929 Other chronic pain: Secondary | ICD-10-CM | POA: Diagnosis not present

## 2018-11-26 DIAGNOSIS — M5417 Radiculopathy, lumbosacral region: Secondary | ICD-10-CM | POA: Diagnosis not present

## 2018-11-26 DIAGNOSIS — Z981 Arthrodesis status: Secondary | ICD-10-CM | POA: Diagnosis not present

## 2018-12-08 DIAGNOSIS — G8929 Other chronic pain: Secondary | ICD-10-CM | POA: Diagnosis not present

## 2018-12-08 DIAGNOSIS — M961 Postlaminectomy syndrome, not elsewhere classified: Secondary | ICD-10-CM | POA: Diagnosis not present

## 2018-12-08 DIAGNOSIS — Z5181 Encounter for therapeutic drug level monitoring: Secondary | ICD-10-CM | POA: Diagnosis not present

## 2018-12-08 DIAGNOSIS — Z79891 Long term (current) use of opiate analgesic: Secondary | ICD-10-CM | POA: Diagnosis not present

## 2018-12-08 DIAGNOSIS — M542 Cervicalgia: Secondary | ICD-10-CM | POA: Diagnosis not present

## 2018-12-08 DIAGNOSIS — M5442 Lumbago with sciatica, left side: Secondary | ICD-10-CM | POA: Diagnosis not present

## 2018-12-16 DIAGNOSIS — Z981 Arthrodesis status: Secondary | ICD-10-CM | POA: Diagnosis not present

## 2018-12-16 DIAGNOSIS — M5417 Radiculopathy, lumbosacral region: Secondary | ICD-10-CM | POA: Diagnosis not present

## 2018-12-16 DIAGNOSIS — M5416 Radiculopathy, lumbar region: Secondary | ICD-10-CM | POA: Diagnosis not present

## 2019-01-05 DIAGNOSIS — M546 Pain in thoracic spine: Secondary | ICD-10-CM | POA: Diagnosis not present

## 2019-01-05 DIAGNOSIS — M4316 Spondylolisthesis, lumbar region: Secondary | ICD-10-CM | POA: Diagnosis not present

## 2019-01-05 DIAGNOSIS — Z6825 Body mass index (BMI) 25.0-25.9, adult: Secondary | ICD-10-CM | POA: Diagnosis not present

## 2019-01-05 DIAGNOSIS — R030 Elevated blood-pressure reading, without diagnosis of hypertension: Secondary | ICD-10-CM | POA: Diagnosis not present

## 2019-01-19 DIAGNOSIS — M542 Cervicalgia: Secondary | ICD-10-CM | POA: Diagnosis not present

## 2019-01-19 DIAGNOSIS — M961 Postlaminectomy syndrome, not elsewhere classified: Secondary | ICD-10-CM | POA: Diagnosis not present

## 2019-01-19 DIAGNOSIS — M5442 Lumbago with sciatica, left side: Secondary | ICD-10-CM | POA: Diagnosis not present

## 2019-01-19 DIAGNOSIS — Z5181 Encounter for therapeutic drug level monitoring: Secondary | ICD-10-CM | POA: Diagnosis not present

## 2019-02-17 DIAGNOSIS — Z5181 Encounter for therapeutic drug level monitoring: Secondary | ICD-10-CM | POA: Diagnosis not present

## 2019-02-17 DIAGNOSIS — M961 Postlaminectomy syndrome, not elsewhere classified: Secondary | ICD-10-CM | POA: Diagnosis not present

## 2019-02-17 DIAGNOSIS — G588 Other specified mononeuropathies: Secondary | ICD-10-CM | POA: Diagnosis not present

## 2019-02-17 DIAGNOSIS — F39 Unspecified mood [affective] disorder: Secondary | ICD-10-CM | POA: Diagnosis not present

## 2019-02-17 DIAGNOSIS — M5442 Lumbago with sciatica, left side: Secondary | ICD-10-CM | POA: Diagnosis not present

## 2019-03-03 DIAGNOSIS — G588 Other specified mononeuropathies: Secondary | ICD-10-CM | POA: Diagnosis not present

## 2019-03-03 DIAGNOSIS — G5702 Lesion of sciatic nerve, left lower limb: Secondary | ICD-10-CM | POA: Diagnosis not present

## 2019-03-09 DIAGNOSIS — G588 Other specified mononeuropathies: Secondary | ICD-10-CM | POA: Diagnosis not present

## 2019-03-29 DIAGNOSIS — G588 Other specified mononeuropathies: Secondary | ICD-10-CM | POA: Diagnosis not present

## 2019-03-31 DIAGNOSIS — M533 Sacrococcygeal disorders, not elsewhere classified: Secondary | ICD-10-CM | POA: Diagnosis not present

## 2019-03-31 DIAGNOSIS — R102 Pelvic and perineal pain: Secondary | ICD-10-CM | POA: Diagnosis not present

## 2019-03-31 DIAGNOSIS — G5702 Lesion of sciatic nerve, left lower limb: Secondary | ICD-10-CM | POA: Diagnosis not present

## 2019-04-15 DIAGNOSIS — G588 Other specified mononeuropathies: Secondary | ICD-10-CM | POA: Diagnosis not present

## 2019-04-15 DIAGNOSIS — G5702 Lesion of sciatic nerve, left lower limb: Secondary | ICD-10-CM | POA: Diagnosis not present

## 2019-05-17 DIAGNOSIS — G588 Other specified mononeuropathies: Secondary | ICD-10-CM | POA: Diagnosis not present

## 2019-06-07 DIAGNOSIS — M546 Pain in thoracic spine: Secondary | ICD-10-CM | POA: Diagnosis not present

## 2019-06-07 DIAGNOSIS — G588 Other specified mononeuropathies: Secondary | ICD-10-CM | POA: Diagnosis not present

## 2019-06-07 DIAGNOSIS — M533 Sacrococcygeal disorders, not elsewhere classified: Secondary | ICD-10-CM | POA: Diagnosis not present

## 2019-06-07 DIAGNOSIS — R030 Elevated blood-pressure reading, without diagnosis of hypertension: Secondary | ICD-10-CM | POA: Diagnosis not present

## 2019-06-10 DIAGNOSIS — M533 Sacrococcygeal disorders, not elsewhere classified: Secondary | ICD-10-CM | POA: Diagnosis not present

## 2019-06-17 DIAGNOSIS — M545 Low back pain: Secondary | ICD-10-CM | POA: Diagnosis not present

## 2019-06-17 DIAGNOSIS — M25552 Pain in left hip: Secondary | ICD-10-CM | POA: Diagnosis not present

## 2019-06-17 DIAGNOSIS — M533 Sacrococcygeal disorders, not elsewhere classified: Secondary | ICD-10-CM | POA: Diagnosis not present

## 2019-06-17 DIAGNOSIS — R269 Unspecified abnormalities of gait and mobility: Secondary | ICD-10-CM | POA: Diagnosis not present

## 2019-06-24 DIAGNOSIS — R269 Unspecified abnormalities of gait and mobility: Secondary | ICD-10-CM | POA: Diagnosis not present

## 2019-06-24 DIAGNOSIS — M533 Sacrococcygeal disorders, not elsewhere classified: Secondary | ICD-10-CM | POA: Diagnosis not present

## 2019-06-24 DIAGNOSIS — M545 Low back pain: Secondary | ICD-10-CM | POA: Diagnosis not present

## 2019-06-24 DIAGNOSIS — M25552 Pain in left hip: Secondary | ICD-10-CM | POA: Diagnosis not present

## 2019-07-08 DIAGNOSIS — M25552 Pain in left hip: Secondary | ICD-10-CM | POA: Diagnosis not present

## 2019-07-08 DIAGNOSIS — M545 Low back pain: Secondary | ICD-10-CM | POA: Diagnosis not present

## 2019-07-08 DIAGNOSIS — M533 Sacrococcygeal disorders, not elsewhere classified: Secondary | ICD-10-CM | POA: Diagnosis not present

## 2019-07-08 DIAGNOSIS — R269 Unspecified abnormalities of gait and mobility: Secondary | ICD-10-CM | POA: Diagnosis not present

## 2019-07-13 DIAGNOSIS — M533 Sacrococcygeal disorders, not elsewhere classified: Secondary | ICD-10-CM | POA: Diagnosis not present

## 2019-07-15 DIAGNOSIS — M545 Low back pain: Secondary | ICD-10-CM | POA: Diagnosis not present

## 2019-07-15 DIAGNOSIS — M25552 Pain in left hip: Secondary | ICD-10-CM | POA: Diagnosis not present

## 2019-07-15 DIAGNOSIS — R269 Unspecified abnormalities of gait and mobility: Secondary | ICD-10-CM | POA: Diagnosis not present

## 2019-07-15 DIAGNOSIS — M533 Sacrococcygeal disorders, not elsewhere classified: Secondary | ICD-10-CM | POA: Diagnosis not present

## 2019-07-19 DIAGNOSIS — Z1231 Encounter for screening mammogram for malignant neoplasm of breast: Secondary | ICD-10-CM | POA: Diagnosis not present

## 2019-07-19 DIAGNOSIS — N83202 Unspecified ovarian cyst, left side: Secondary | ICD-10-CM | POA: Diagnosis not present

## 2019-07-19 DIAGNOSIS — Z30431 Encounter for routine checking of intrauterine contraceptive device: Secondary | ICD-10-CM | POA: Diagnosis not present

## 2019-07-22 DIAGNOSIS — R269 Unspecified abnormalities of gait and mobility: Secondary | ICD-10-CM | POA: Diagnosis not present

## 2019-07-22 DIAGNOSIS — M533 Sacrococcygeal disorders, not elsewhere classified: Secondary | ICD-10-CM | POA: Diagnosis not present

## 2019-07-22 DIAGNOSIS — M25552 Pain in left hip: Secondary | ICD-10-CM | POA: Diagnosis not present

## 2019-07-22 DIAGNOSIS — M545 Low back pain: Secondary | ICD-10-CM | POA: Diagnosis not present

## 2019-07-29 DIAGNOSIS — M545 Low back pain: Secondary | ICD-10-CM | POA: Diagnosis not present

## 2019-07-29 DIAGNOSIS — M533 Sacrococcygeal disorders, not elsewhere classified: Secondary | ICD-10-CM | POA: Diagnosis not present

## 2019-07-29 DIAGNOSIS — M25552 Pain in left hip: Secondary | ICD-10-CM | POA: Diagnosis not present

## 2019-07-29 DIAGNOSIS — R269 Unspecified abnormalities of gait and mobility: Secondary | ICD-10-CM | POA: Diagnosis not present

## 2019-08-05 DIAGNOSIS — M25552 Pain in left hip: Secondary | ICD-10-CM | POA: Diagnosis not present

## 2019-08-05 DIAGNOSIS — M533 Sacrococcygeal disorders, not elsewhere classified: Secondary | ICD-10-CM | POA: Diagnosis not present

## 2019-08-05 DIAGNOSIS — M545 Low back pain: Secondary | ICD-10-CM | POA: Diagnosis not present

## 2019-08-05 DIAGNOSIS — R269 Unspecified abnormalities of gait and mobility: Secondary | ICD-10-CM | POA: Diagnosis not present

## 2019-08-12 DIAGNOSIS — M533 Sacrococcygeal disorders, not elsewhere classified: Secondary | ICD-10-CM | POA: Diagnosis not present

## 2019-08-12 DIAGNOSIS — R030 Elevated blood-pressure reading, without diagnosis of hypertension: Secondary | ICD-10-CM | POA: Diagnosis not present

## 2019-08-12 DIAGNOSIS — R269 Unspecified abnormalities of gait and mobility: Secondary | ICD-10-CM | POA: Diagnosis not present

## 2019-08-12 DIAGNOSIS — M545 Low back pain: Secondary | ICD-10-CM | POA: Diagnosis not present

## 2019-08-12 DIAGNOSIS — M25552 Pain in left hip: Secondary | ICD-10-CM | POA: Diagnosis not present

## 2019-08-18 ENCOUNTER — Other Ambulatory Visit: Payer: Self-pay | Admitting: Neurological Surgery

## 2019-08-18 DIAGNOSIS — M545 Low back pain, unspecified: Secondary | ICD-10-CM

## 2019-08-19 DIAGNOSIS — M25552 Pain in left hip: Secondary | ICD-10-CM | POA: Diagnosis not present

## 2019-08-19 DIAGNOSIS — M533 Sacrococcygeal disorders, not elsewhere classified: Secondary | ICD-10-CM | POA: Diagnosis not present

## 2019-08-19 DIAGNOSIS — M545 Low back pain: Secondary | ICD-10-CM | POA: Diagnosis not present

## 2019-08-19 DIAGNOSIS — R269 Unspecified abnormalities of gait and mobility: Secondary | ICD-10-CM | POA: Diagnosis not present

## 2019-08-26 ENCOUNTER — Ambulatory Visit
Admission: RE | Admit: 2019-08-26 | Discharge: 2019-08-26 | Disposition: A | Discharge status: Home / Self Care | Payer: BC Managed Care – PPO | Source: Ambulatory Visit | Attending: Neurological Surgery | Admitting: Neurological Surgery

## 2019-08-26 ENCOUNTER — Other Ambulatory Visit: Payer: Self-pay

## 2019-08-26 DIAGNOSIS — M545 Low back pain, unspecified: Secondary | ICD-10-CM

## 2019-08-26 DIAGNOSIS — M533 Sacrococcygeal disorders, not elsewhere classified: Secondary | ICD-10-CM | POA: Diagnosis not present

## 2019-08-26 DIAGNOSIS — M25552 Pain in left hip: Secondary | ICD-10-CM | POA: Diagnosis not present

## 2019-08-26 DIAGNOSIS — R269 Unspecified abnormalities of gait and mobility: Secondary | ICD-10-CM | POA: Diagnosis not present

## 2019-08-26 IMAGING — CT CT L SPINE W/O CM
1 of 6 series · 7 of 14 positions shown, 9 images · non-contrast
Comparison: Radiography [DATE]. MRI [DATE]. CT [DATE].

CLINICAL DATA: L5-S1 fusion 20 years ago. Left-sided low back pain
radiating down the left leg.

EXAM:
CT LUMBAR SPINE WITHOUT CONTRAST
TECHNIQUE: Multidetector CT imaging of the lumbar spine was performed without
intravenous contrast administration. Multiplanar CT image
reconstructions were also generated.

[Series 3: l spine soft · axial · 0.32mm/px · z∈[-311,-92]mm · 7 of 99 slices shown, 9 images]
[im 13/99  soft-tissue]
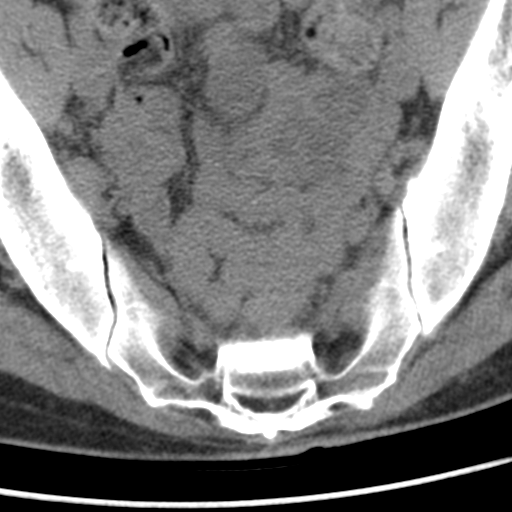
[im 13/99  bone]
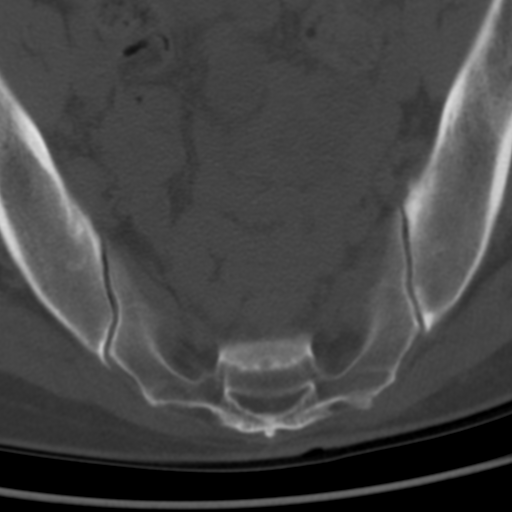
[im 25/99  bone]
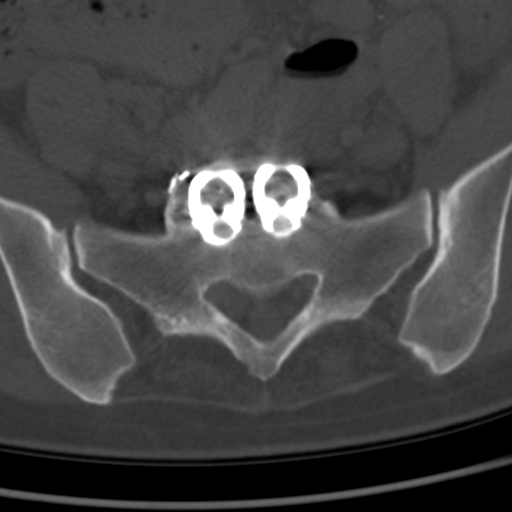
[im 37/99  bone]
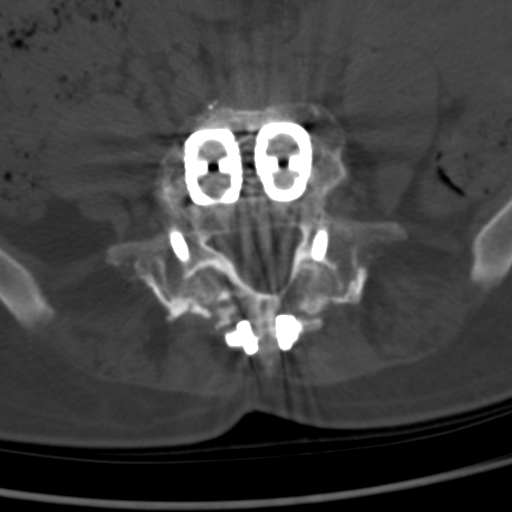
[im 50/99  bone]
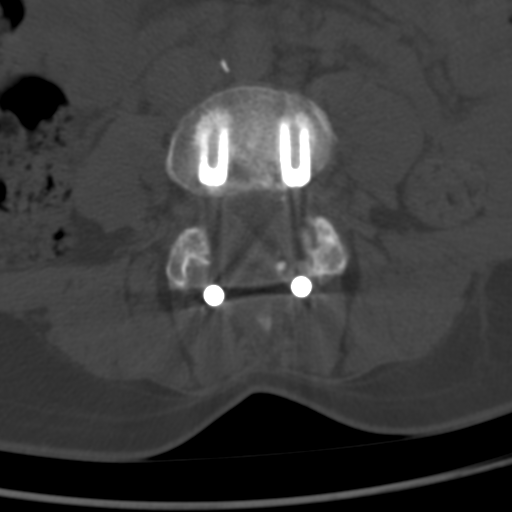
[im 62/99  soft-tissue]
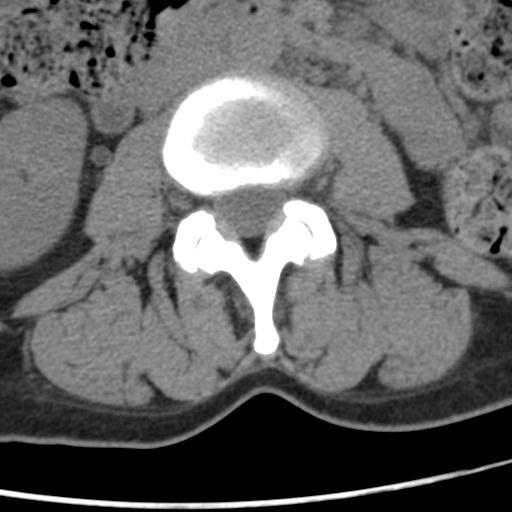
[im 62/99  bone]
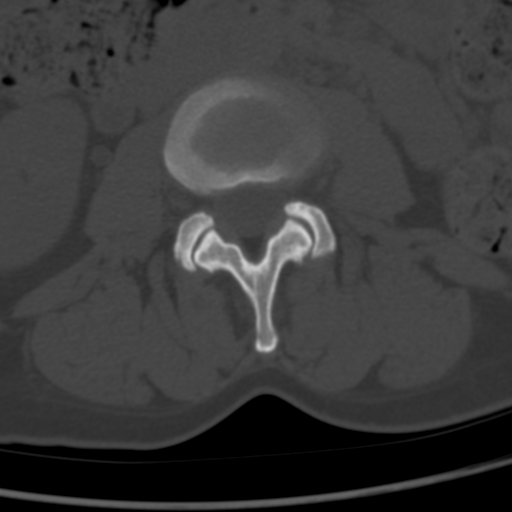
[im 74/99  bone]
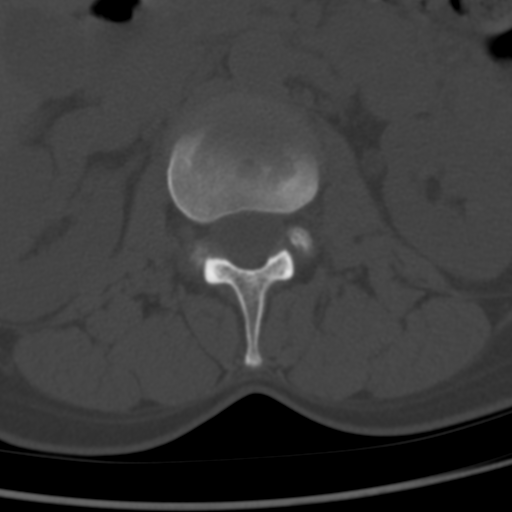
[im 86/99  bone]
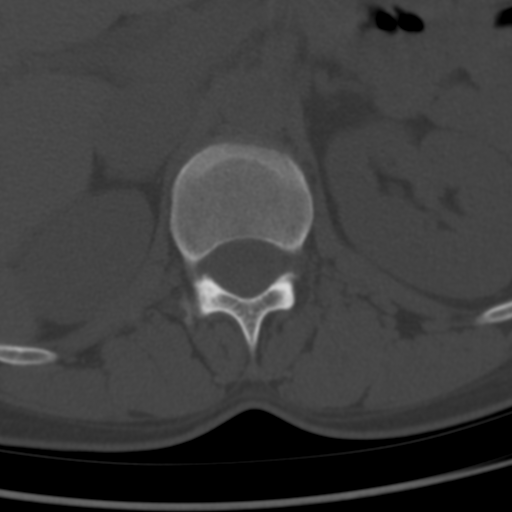

[7 of 14 positions shown; findings below may reference images not displayed]

FINDINGS: Segmentation: 6 non rib-bearing lumbar vertebral bodies. Numbering
terminology kept constant with the study of [A0]. Lowest fusion
level described as L5-S1.

Alignment: Normal

Vertebrae: No fracture or primary bone lesion. See below regarding
fusion findings.

Paraspinal and other soft tissues: Negative

Disc levels: No abnormality seen from T11-12 through T12-L1.

L1-2: Minimal disc bulge. No compressive stenosis.

L2-3: Mild disc bulge with annular calcification. Mild facet
osteoarthritis. No compressive stenosis. Findings could relate to
back pain.

L3-4: Previous posterior decompression, diskectomy and fusion.
Fusion hardware is satisfactory without evidence of loosening or
fracture. There is some lucency within the interbody material, but
the overall picture does not look like abnormal motion is occurring.
No abnormal nitrogen gas.

L4-5: Previous posterior decompression and cage fusion. Inter
spinous plate. Bilateral trans facetal screws. Fusion is solid at
this level with wide patency of the canal and foramina.

L5-S1: Previous posterior decompression and cage fusion. Fusion is
solid. No canal or foraminal stenosis.

There is bilateral sacroiliac osteoarthritis which could be painful.
IMPRESSION: 1. 6 non rib-bearing lumbar vertebral bodies. Lowest fusion level
described as L5-S1.
2. L2-3: Mild disc bulge with annular calcification. Mild facet
osteoarthritis. No compressive stenosis.
3. L3-4: Previous posterior decompression, diskectomy and fusion.
Fusion hardware is satisfactory without evidence of loosening or
fracture. Some lucency within the interbody material, but the
overall picture does not look like abnormal motion.
4. L4-5: Previous posterior decompression and cage fusion with wide
patency of the canal and foramina.
5. L5-S1: Solid fusion. No canal or foraminal stenosis.
6. Bilateral sacroiliac osteoarthritis which could be painful.

## 2019-09-02 DIAGNOSIS — Z01419 Encounter for gynecological examination (general) (routine) without abnormal findings: Secondary | ICD-10-CM | POA: Diagnosis not present

## 2019-09-02 DIAGNOSIS — Z1151 Encounter for screening for human papillomavirus (HPV): Secondary | ICD-10-CM | POA: Diagnosis not present

## 2019-09-02 DIAGNOSIS — Z30432 Encounter for removal of intrauterine contraceptive device: Secondary | ICD-10-CM | POA: Diagnosis not present

## 2019-09-02 DIAGNOSIS — Z6824 Body mass index (BMI) 24.0-24.9, adult: Secondary | ICD-10-CM | POA: Diagnosis not present

## 2019-09-03 DIAGNOSIS — M545 Low back pain: Secondary | ICD-10-CM | POA: Diagnosis not present

## 2019-09-03 DIAGNOSIS — M25552 Pain in left hip: Secondary | ICD-10-CM | POA: Diagnosis not present

## 2019-09-03 DIAGNOSIS — M533 Sacrococcygeal disorders, not elsewhere classified: Secondary | ICD-10-CM | POA: Diagnosis not present

## 2019-09-03 DIAGNOSIS — R269 Unspecified abnormalities of gait and mobility: Secondary | ICD-10-CM | POA: Diagnosis not present

## 2019-09-09 DIAGNOSIS — M25552 Pain in left hip: Secondary | ICD-10-CM | POA: Diagnosis not present

## 2019-09-09 DIAGNOSIS — M545 Low back pain: Secondary | ICD-10-CM | POA: Diagnosis not present

## 2019-09-09 DIAGNOSIS — R269 Unspecified abnormalities of gait and mobility: Secondary | ICD-10-CM | POA: Diagnosis not present

## 2019-09-09 DIAGNOSIS — N854 Malposition of uterus: Secondary | ICD-10-CM | POA: Diagnosis not present

## 2019-09-09 DIAGNOSIS — M533 Sacrococcygeal disorders, not elsewhere classified: Secondary | ICD-10-CM | POA: Diagnosis not present

## 2019-09-09 DIAGNOSIS — Z30430 Encounter for insertion of intrauterine contraceptive device: Secondary | ICD-10-CM | POA: Diagnosis not present

## 2019-09-14 ENCOUNTER — Other Ambulatory Visit: Payer: Self-pay | Admitting: Obstetrics and Gynecology

## 2019-09-14 DIAGNOSIS — Z1501 Genetic susceptibility to malignant neoplasm of breast: Secondary | ICD-10-CM

## 2019-09-14 DIAGNOSIS — Z15060 Genetic susceptibility to colorectal cancer: Secondary | ICD-10-CM

## 2019-09-14 DIAGNOSIS — Z1509 Genetic susceptibility to other malignant neoplasm: Secondary | ICD-10-CM

## 2019-09-15 DIAGNOSIS — R269 Unspecified abnormalities of gait and mobility: Secondary | ICD-10-CM | POA: Diagnosis not present

## 2019-09-15 DIAGNOSIS — M545 Low back pain: Secondary | ICD-10-CM | POA: Diagnosis not present

## 2019-09-15 DIAGNOSIS — M25552 Pain in left hip: Secondary | ICD-10-CM | POA: Diagnosis not present

## 2019-09-15 DIAGNOSIS — M533 Sacrococcygeal disorders, not elsewhere classified: Secondary | ICD-10-CM | POA: Diagnosis not present

## 2019-09-23 DIAGNOSIS — M545 Low back pain: Secondary | ICD-10-CM | POA: Diagnosis not present

## 2019-09-30 DIAGNOSIS — M545 Low back pain: Secondary | ICD-10-CM | POA: Diagnosis not present

## 2019-09-30 DIAGNOSIS — R269 Unspecified abnormalities of gait and mobility: Secondary | ICD-10-CM | POA: Diagnosis not present

## 2019-09-30 DIAGNOSIS — M25552 Pain in left hip: Secondary | ICD-10-CM | POA: Diagnosis not present

## 2019-09-30 DIAGNOSIS — M533 Sacrococcygeal disorders, not elsewhere classified: Secondary | ICD-10-CM | POA: Diagnosis not present

## 2019-10-06 DIAGNOSIS — R030 Elevated blood-pressure reading, without diagnosis of hypertension: Secondary | ICD-10-CM | POA: Diagnosis not present

## 2019-10-06 DIAGNOSIS — M461 Sacroiliitis, not elsewhere classified: Secondary | ICD-10-CM | POA: Diagnosis not present

## 2019-10-06 DIAGNOSIS — M4316 Spondylolisthesis, lumbar region: Secondary | ICD-10-CM | POA: Diagnosis not present

## 2019-10-27 ENCOUNTER — Other Ambulatory Visit: Payer: Self-pay | Admitting: Obstetrics and Gynecology

## 2019-10-28 ENCOUNTER — Other Ambulatory Visit: Payer: BC Managed Care – PPO

## 2020-03-14 ENCOUNTER — Other Ambulatory Visit: Payer: Self-pay | Admitting: Obstetrics and Gynecology

## 2020-11-16 ENCOUNTER — Other Ambulatory Visit: Payer: Self-pay | Admitting: Physician Assistant

## 2020-11-16 DIAGNOSIS — R160 Hepatomegaly, not elsewhere classified: Secondary | ICD-10-CM

## 2020-11-30 ENCOUNTER — Other Ambulatory Visit: Payer: Self-pay | Admitting: Physician Assistant

## 2020-11-30 DIAGNOSIS — G8929 Other chronic pain: Secondary | ICD-10-CM

## 2020-11-30 DIAGNOSIS — R102 Pelvic and perineal pain: Secondary | ICD-10-CM

## 2020-12-18 ENCOUNTER — Ambulatory Visit
Admission: RE | Admit: 2020-12-18 | Discharge: 2020-12-18 | Disposition: A | Discharge status: Home / Self Care | Payer: BC Managed Care – PPO | Source: Ambulatory Visit | Attending: Physician Assistant | Admitting: Physician Assistant

## 2020-12-18 DIAGNOSIS — G8929 Other chronic pain: Secondary | ICD-10-CM

## 2020-12-18 DIAGNOSIS — R160 Hepatomegaly, not elsewhere classified: Secondary | ICD-10-CM

## 2020-12-18 DIAGNOSIS — R102 Pelvic and perineal pain: Secondary | ICD-10-CM

## 2020-12-18 IMAGING — MR MR PELVIS WO/W CM
5 of 7 series · 33 of 48 positions shown · IV contrast (12 ml Multihance)
Comparison: None.

CLINICAL DATA: Generalized abdominal and pelvic pain.  Liver mass.

EXAM:
MRI ABDOMEN AND PELVIS WITHOUT AND WITH CONTRAST
TECHNIQUE: Multiplanar multisequence MR imaging of the abdomen and pelvis was
performed both before and after the administration of intravenous
contrast.
CONTRAST:  12mL MULTIHANCE GADOBENATE DIMEGLUMINE 529 MG/ML IV SOLN

[Series 2: cor haste · coronal · 6.0mm · 0.78mm/px · 6 of 25 slices shown]
[im 1/25]
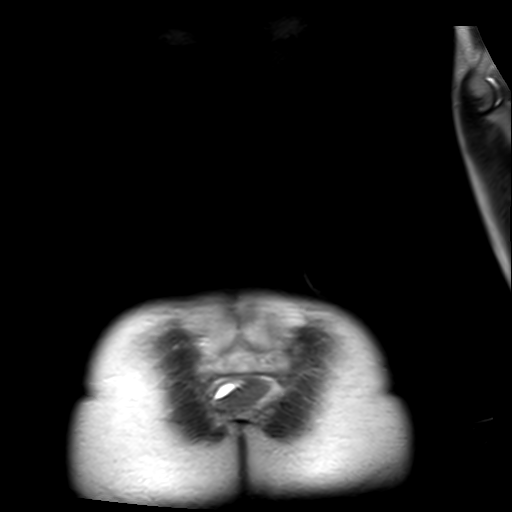
[im 5/25]
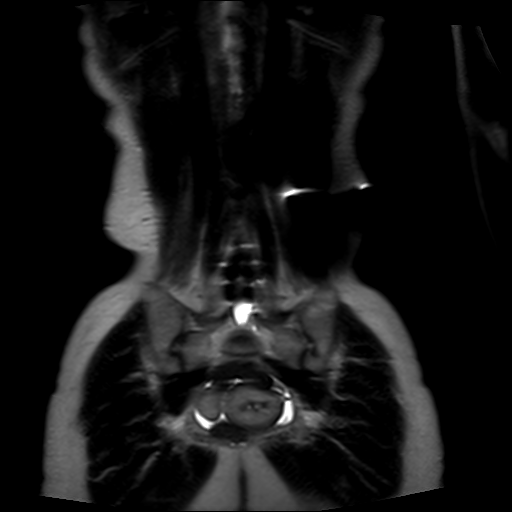
[im 10/25]
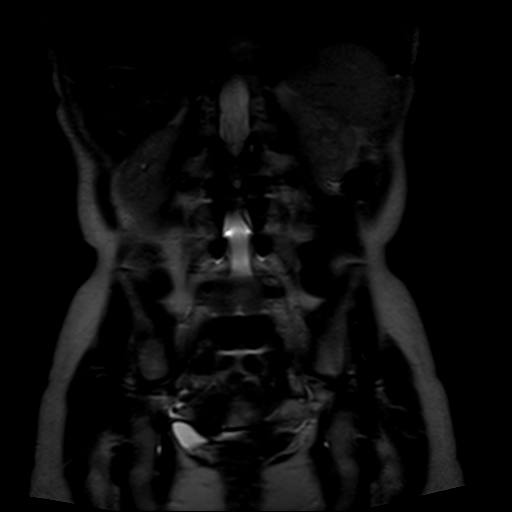
[im 15/25]
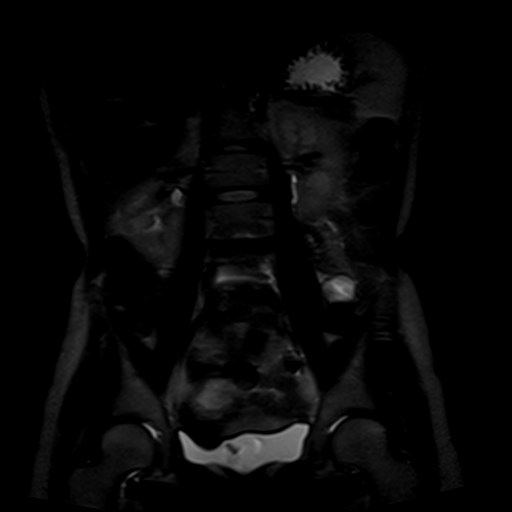
[im 20/25]
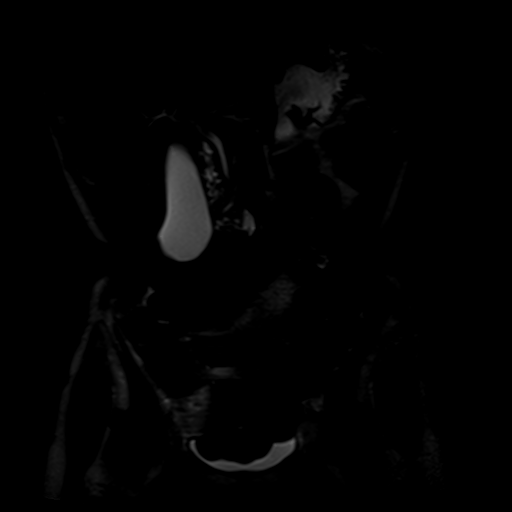
[im 25/25]
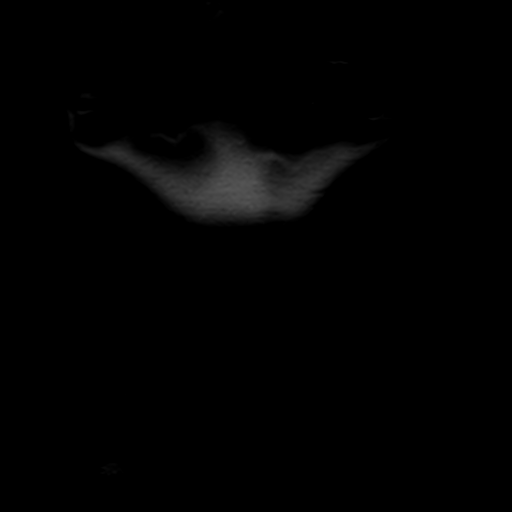

[Series 3: t2_tse_sag · sagittal · 5.0mm · 1.05mm/px · 7 of 29 slices shown]
[im 1/29]
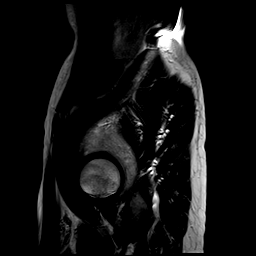
[im 5/29]
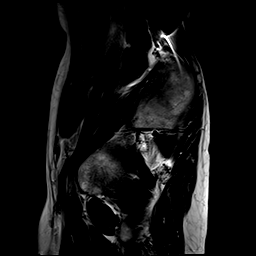
[im 10/29]
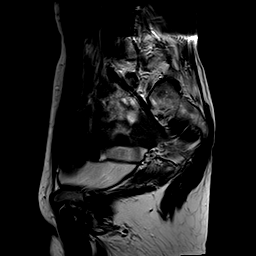
[im 15/29]
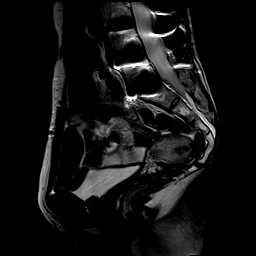
[im 19/29]
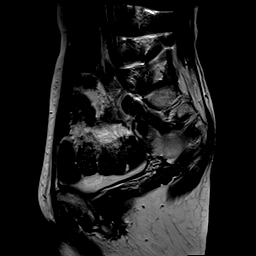
[im 24/29]
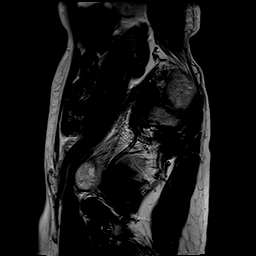
[im 29/29]
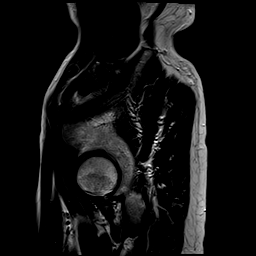

[Series 5: axial spgr · axial · 7.0mm · 0.98mm/px · z∈[-185,+69]mm · 7 of 29 slices shown]
[im 1/29]
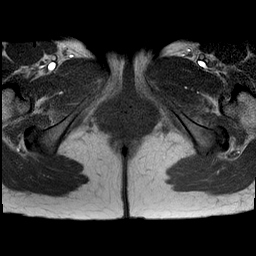
[im 5/29]
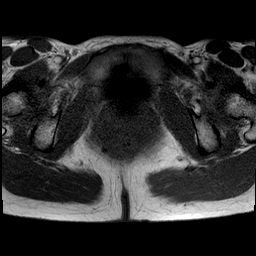
[im 10/29]
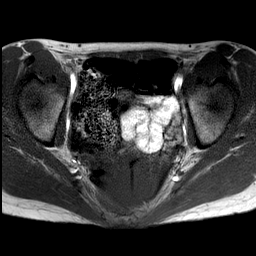
[im 15/29]
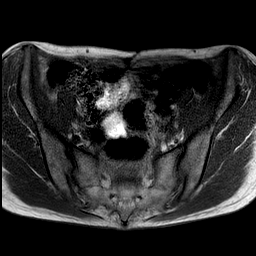
[im 19/29]
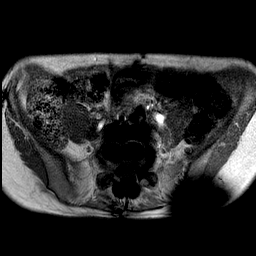
[im 24/29]
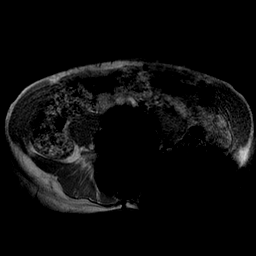
[im 29/29]
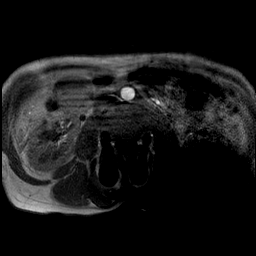

[Series 6: t2_tse axial fs · axial · 7.0mm · 0.98mm/px · z∈[-185,+69]mm · 7 of 29 slices shown]
[im 1/29]
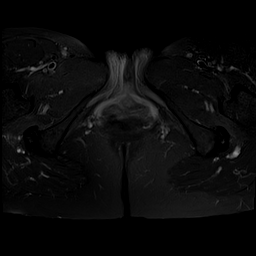
[im 5/29]
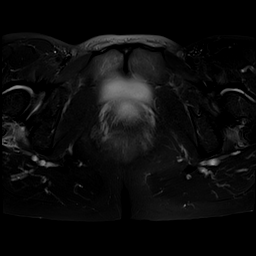
[im 10/29]
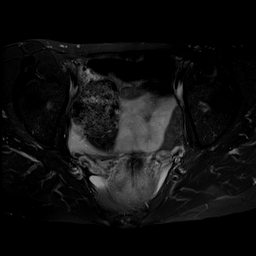
[im 15/29]
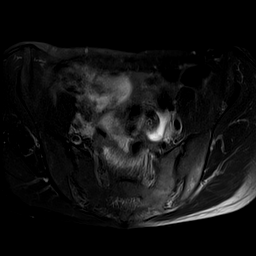
[im 19/29]
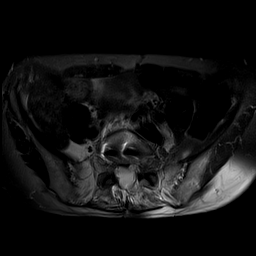
[im 24/29]
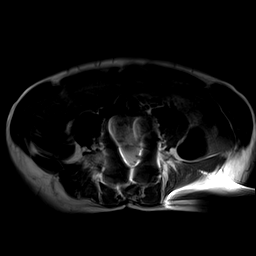
[im 29/29]
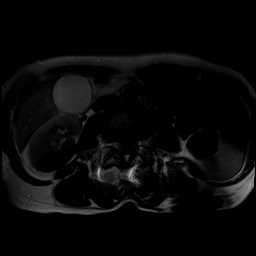

[Series 7: axial spgr pre · axial · non-contrast · 7.0mm · 0.49mm/px · z∈[-185,+24]mm · 6 of 29 slices shown]
[im 1/29]
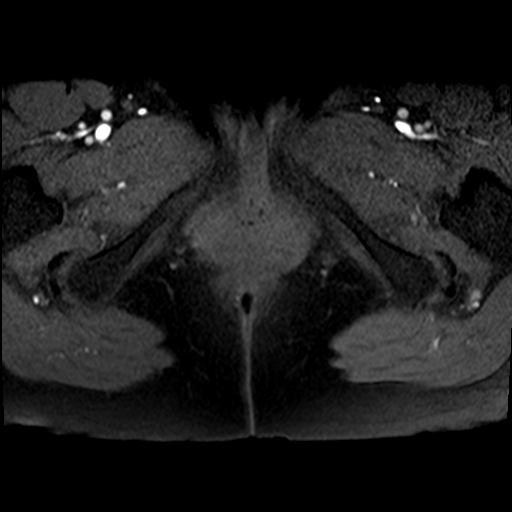
[im 5/29]
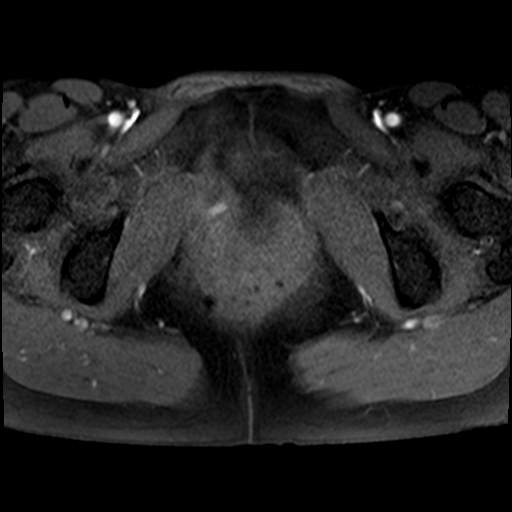
[im 10/29]
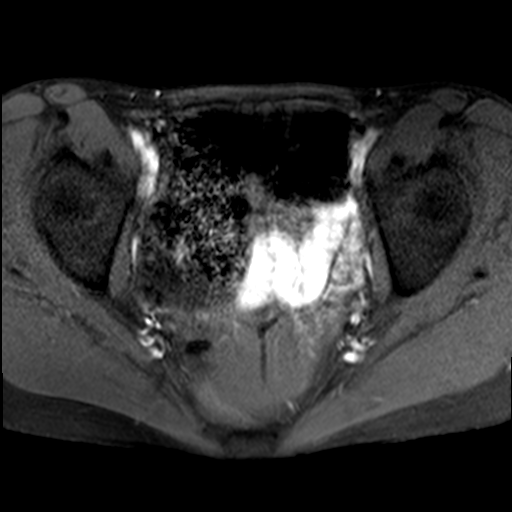
[im 15/29]
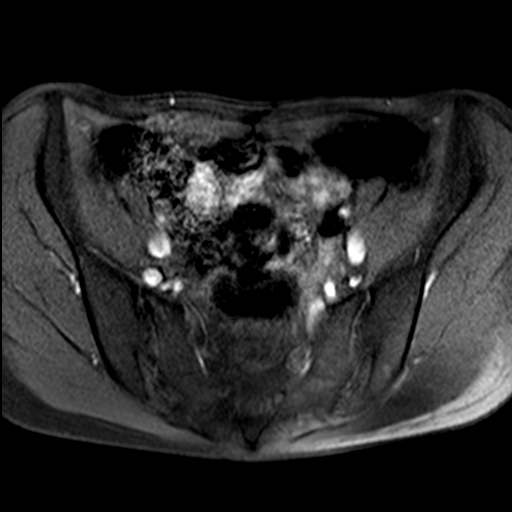
[im 19/29]
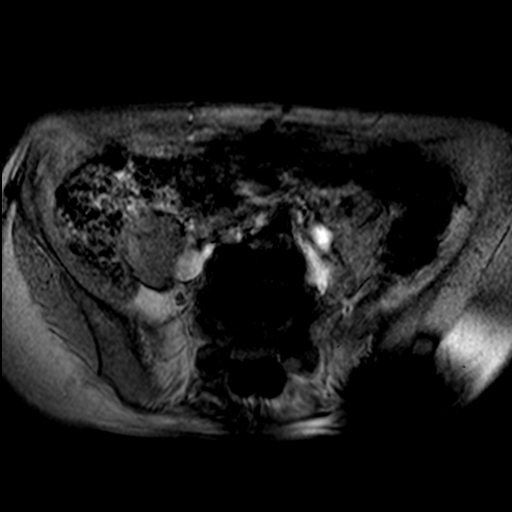
[im 24/29]
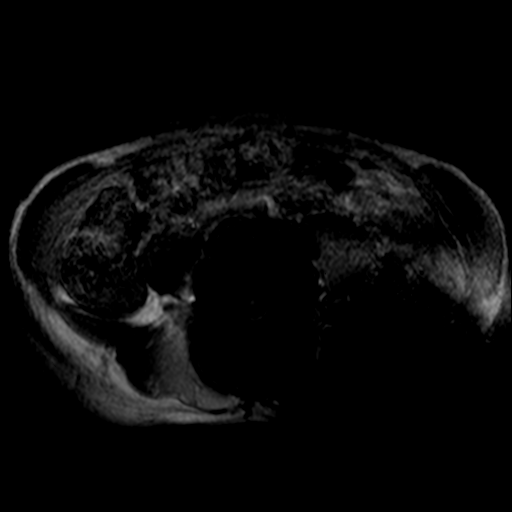

[33 of 48 positions shown; findings below may reference images not displayed]

FINDINGS: COMBINED FINDINGS FOR BOTH MR ABDOMEN AND PELVIS

Lower Chest: No acute findings.

Hepatobiliary: A mass is seen in the lateral right hepatic lobe
which measures 4.2 x 3.2 cm. This mass shows diffuse arterial phase
hyperenhancement and central scar, lack of contrast washout, and T2
isointensity, consistent with focal nodular hyperplasia. Two similar
but smaller masses are seen in the left hepatic lobe which measure
2.1 cm (image 30/13), and 1.3 cm (image 54/13).

Gallbladder is unremarkable. No evidence of biliary ductal
dilatation.

Pancreas:  No mass or inflammatory changes.

Spleen: Within normal limits in size and appearance.

Adrenals/Urinary Tract: No masses identified. No evidence of
ureteral calculi or hydronephrosis.

Stomach/Bowel: No evidence of obstruction, inflammatory process or
abnormal fluid collections.

Vascular/Lymphatic: No pathologically enlarged lymph nodes. No
abdominal aortic aneurysm.

Reproductive:

-- Uterus: Measures 6.6 x 3.6 x 5.2 cm (volume = 65 cm^3).
Retroflexed. A tiny less than 1 cm intramural fibroid is seen in the
uterine fundus. IUD is seen in expected position in the endometrial
cavity. Cervix and vagina are unremarkable in appearance.

-- Right ovary: Appears normal. No mass or inflammatory process
identified.

-- Left ovary:  Not visualized, however no adnexal mass identified.

Other:  None.

Musculoskeletal: Artifact from lumbosacral spine fusion hardware
noted. No suspicious bone lesions identified.
IMPRESSION: No acute findings within the abdomen or pelvis.

Several hepatic masses measuring up to 4 cm, consistent with benign
focal nodular hyperplasia.

IUD in expected position. Tiny less than 1 cm fibroid in the uterine
fundus.

## 2020-12-18 IMAGING — MR MR ABDOMEN WO/W CM
12 of 17 series · 26 of 48 positions shown · IV contrast (multihance)
Comparison: None.

CLINICAL DATA: Generalized abdominal and pelvic pain.  Liver mass.

EXAM:
MRI ABDOMEN AND PELVIS WITHOUT AND WITH CONTRAST
TECHNIQUE: Multiplanar multisequence MR imaging of the abdomen and pelvis was
performed both before and after the administration of intravenous
contrast.
CONTRAST:  12mL MULTIHANCE GADOBENATE DIMEGLUMINE 529 MG/ML IV SOLN

[Series 3: cor haste · coronal · 5.0mm · 0.68mm/px · 1 of 30 slices shown]
[im 1/30]
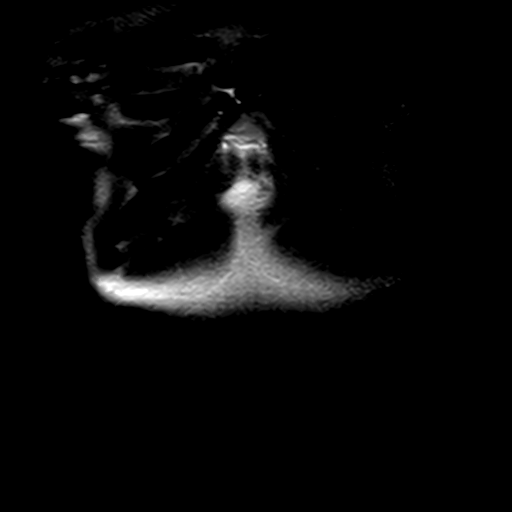

[Series 4: axial haste · axial · 6.0mm · 0.68mm/px · 1 of 35 slices shown]
[im 1/35]
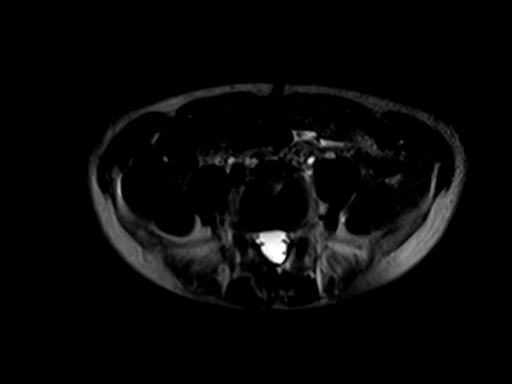

[Series 5: T1 · axial · 6.0mm · 0.68mm/px · z∈[+8,+238]mm · 2 of 72 slices shown]
[im 1/72]
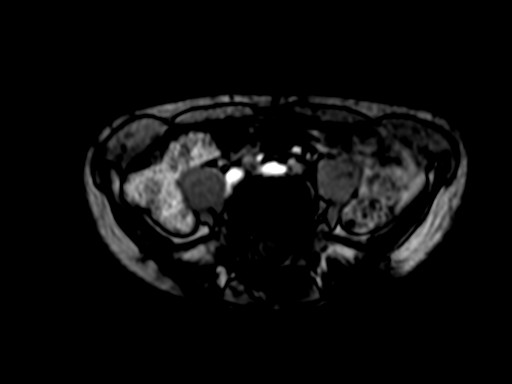
[im 72/72]
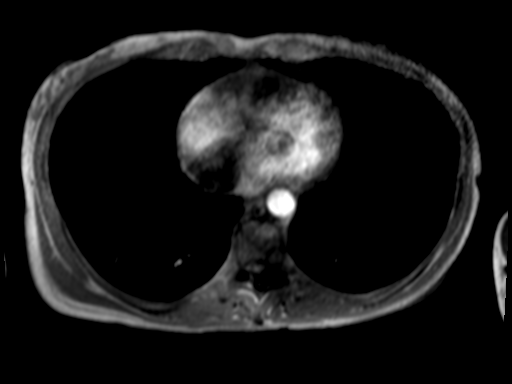

[Series 6: bSSFP · axial · 4.0mm · 0.68mm/px · 1 of 61 slices shown]
[im 1/61]
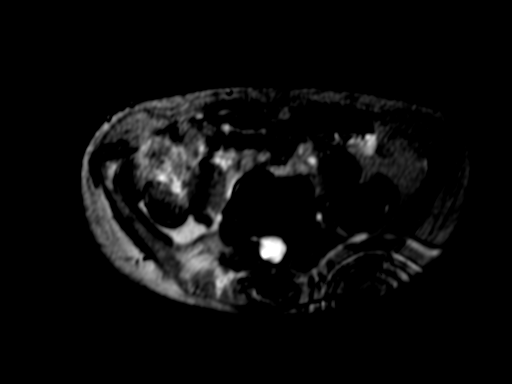

[Series 9: ep2d_diff_b50_500_800_p2_trig · axial · 6.0mm · 1.82mm/px · z∈[+24,+268]mm · 3 of 98 slices shown]
[im 1/98]
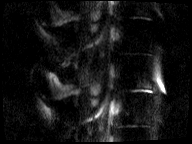
[im 49/98]
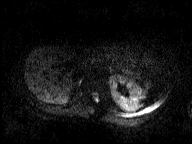
[im 98/98]
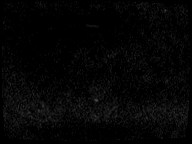

[Series 10: ep2d_diff_b50_500_800_p2_trig_adc · axial · 6.0mm · 1.82mm/px · 1 of 36 slices shown]
[im 1/36]
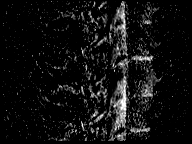

[Series 11: T2 · axial · 6.0mm · 1.09mm/px · 1 of 34 slices shown]
[im 1/34]
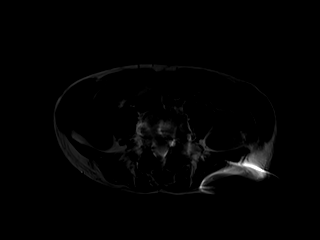

[Series 12: T1 dynamic · axial · non-contrast · 2.5mm · 0.74mm/px · z∈[+10,+247]mm · 3 of 96 slices shown]
[im 1/96]
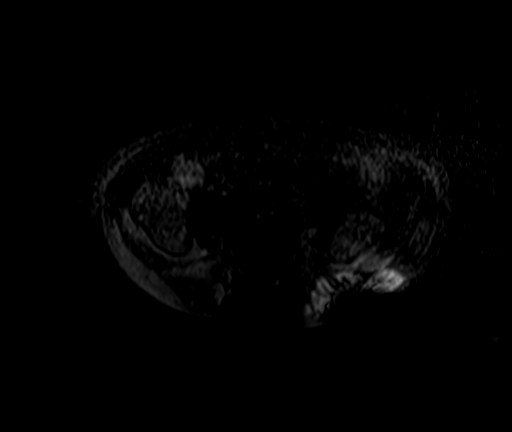
[im 48/96]
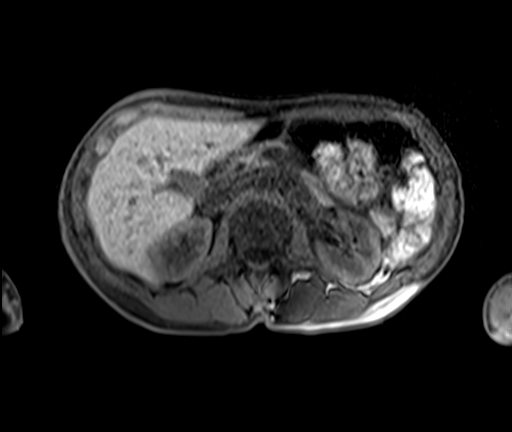
[im 96/96]
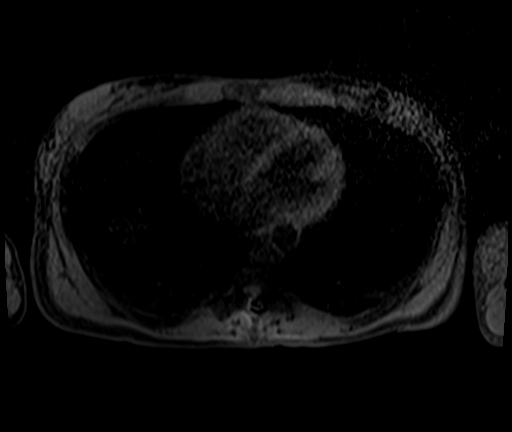

[Series 13: T1 dynamic post-contrast · axial · 2.5mm · 0.74mm/px · z∈[+10,+247]mm · 4 of 96 slices shown (1 of 4)]
[im 1/96]
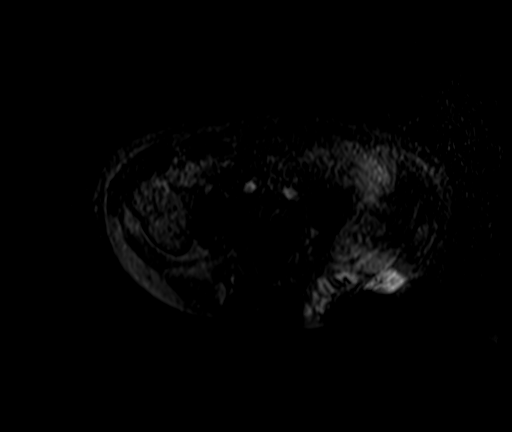
[im 32/96]
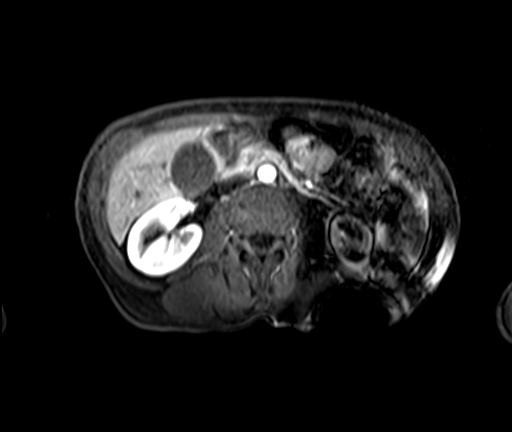
[im 64/96]
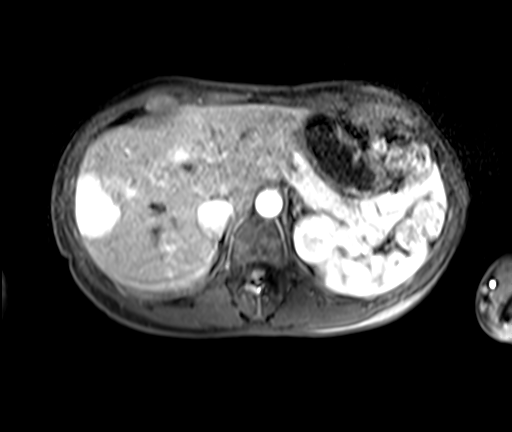
[im 96/96]
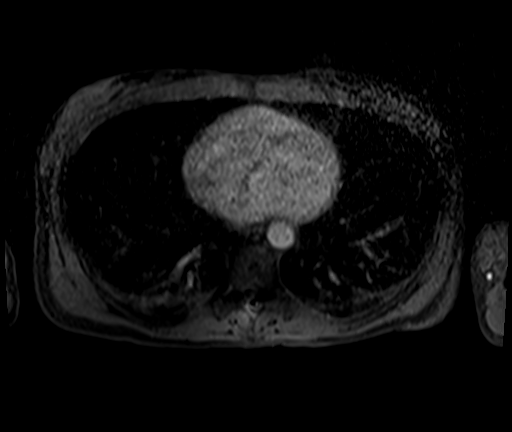

[Series 14: T1 dynamic post-contrast · axial · 2.5mm · 0.74mm/px · z∈[+10,+247]mm · 4 of 96 slices shown (2 of 4)]
[im 1/96]
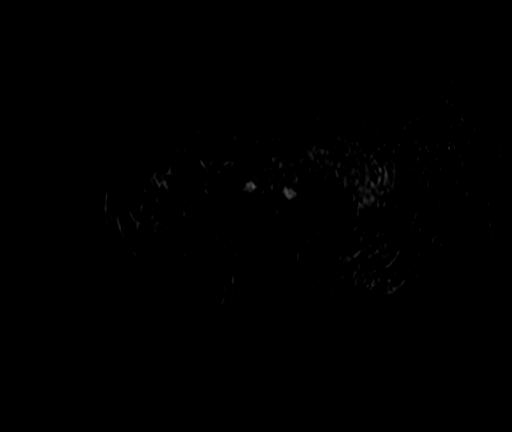
[im 32/96]
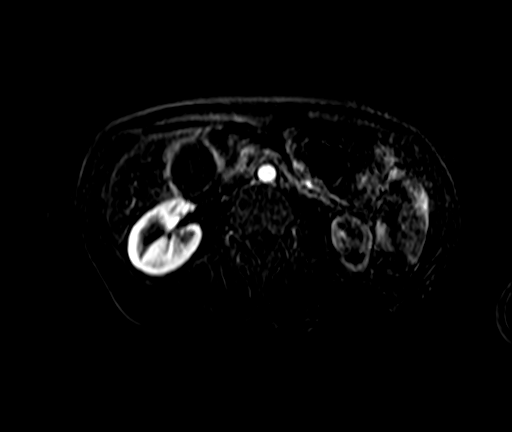
[im 64/96]
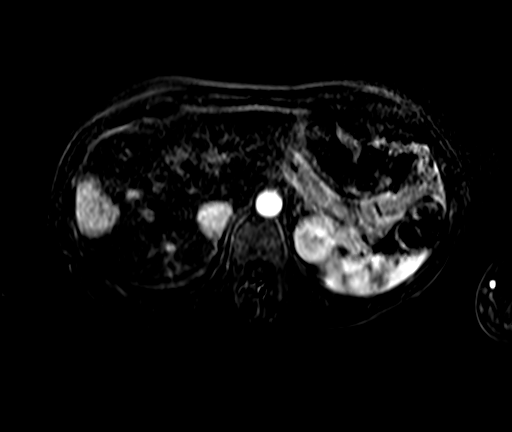
[im 96/96]
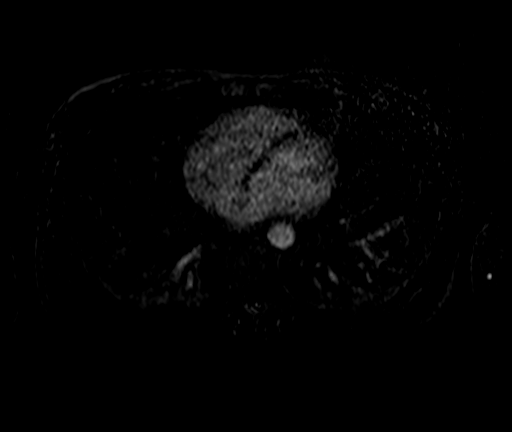

[Series 15: T1 dynamic post-contrast · axial · 2.5mm · 0.74mm/px · z∈[+10,+247]mm · 4 of 96 slices shown (3 of 4)]
[im 1/96]
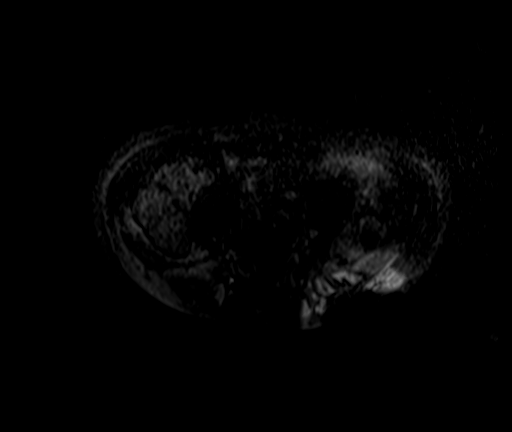
[im 32/96]
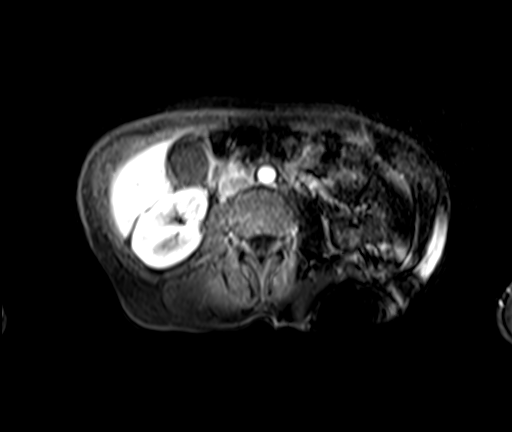
[im 64/96]
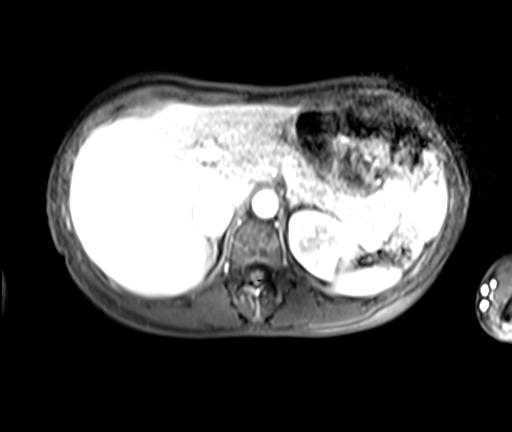
[im 96/96]
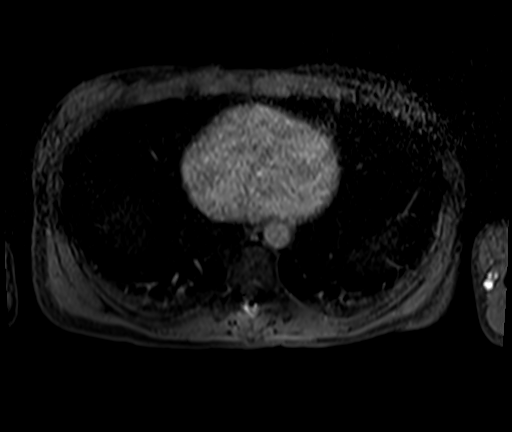

[Series 16: T1 dynamic post-contrast · axial · 2.5mm · 0.74mm/px · 1 of 96 slices shown (4 of 4)]
[im 1/96]
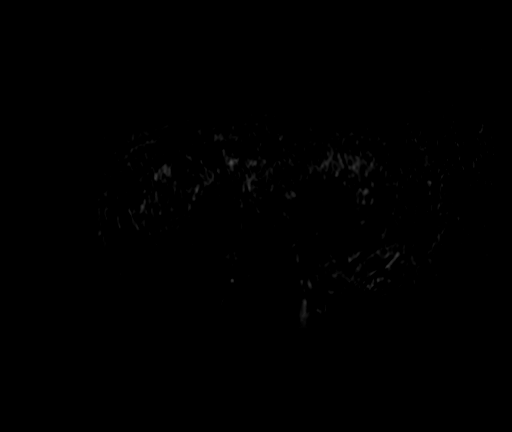

[26 of 48 positions shown; findings below may reference images not displayed]

FINDINGS: COMBINED FINDINGS FOR BOTH MR ABDOMEN AND PELVIS

Lower Chest: No acute findings.

Hepatobiliary: A mass is seen in the lateral right hepatic lobe
which measures 4.2 x 3.2 cm. This mass shows diffuse arterial phase
hyperenhancement and central scar, lack of contrast washout, and T2
isointensity, consistent with focal nodular hyperplasia. Two similar
but smaller masses are seen in the left hepatic lobe which measure
2.1 cm (image 30/13), and 1.3 cm (image 54/13).

Gallbladder is unremarkable. No evidence of biliary ductal
dilatation.

Pancreas:  No mass or inflammatory changes.

Spleen: Within normal limits in size and appearance.

Adrenals/Urinary Tract: No masses identified. No evidence of
ureteral calculi or hydronephrosis.

Stomach/Bowel: No evidence of obstruction, inflammatory process or
abnormal fluid collections.

Vascular/Lymphatic: No pathologically enlarged lymph nodes. No
abdominal aortic aneurysm.

Reproductive:

-- Uterus: Measures 6.6 x 3.6 x 5.2 cm (volume = 65 cm^3).
Retroflexed. A tiny less than 1 cm intramural fibroid is seen in the
uterine fundus. IUD is seen in expected position in the endometrial
cavity. Cervix and vagina are unremarkable in appearance.

-- Right ovary: Appears normal. No mass or inflammatory process
identified.

-- Left ovary:  Not visualized, however no adnexal mass identified.

Other:  None.

Musculoskeletal: Artifact from lumbosacral spine fusion hardware
noted. No suspicious bone lesions identified.
IMPRESSION: No acute findings within the abdomen or pelvis.

Several hepatic masses measuring up to 4 cm, consistent with benign
focal nodular hyperplasia.

IUD in expected position. Tiny less than 1 cm fibroid in the uterine
fundus.

## 2020-12-18 MED ORDER — GADOBENATE DIMEGLUMINE 529 MG/ML IV SOLN
12.0000 mL | Freq: Once | INTRAVENOUS | Status: AC | PRN
  Administered 2020-12-18: 12 mL via INTRAVENOUS

## 2021-06-18 ENCOUNTER — Other Ambulatory Visit: Payer: Self-pay | Admitting: Unknown Physician Specialty

## 2021-06-18 ENCOUNTER — Other Ambulatory Visit: Payer: Self-pay | Admitting: Physician Assistant

## 2021-06-18 DIAGNOSIS — G8929 Other chronic pain: Secondary | ICD-10-CM

## 2021-06-18 DIAGNOSIS — M546 Pain in thoracic spine: Secondary | ICD-10-CM

## 2021-06-29 ENCOUNTER — Ambulatory Visit
Admission: RE | Admit: 2021-06-29 | Discharge: 2021-06-29 | Disposition: A | Discharge status: Home / Self Care | Payer: BC Managed Care – PPO | Source: Ambulatory Visit | Attending: Physician Assistant | Admitting: Physician Assistant

## 2021-06-29 DIAGNOSIS — G8929 Other chronic pain: Secondary | ICD-10-CM

## 2021-06-29 DIAGNOSIS — M546 Pain in thoracic spine: Secondary | ICD-10-CM

## 2021-06-29 DIAGNOSIS — R102 Pelvic and perineal pain: Secondary | ICD-10-CM

## 2021-06-29 IMAGING — CT CT PELVIS W/ CM
2 of 3 series · 16 of 46 positions shown, 18 images · IV contrast (iopamidol)
Comparison: MRI [DATE]

CLINICAL DATA: Buttock pain, lower pelvic pain, tethered cord
syndrome, Guillain-Barre syndrome

EXAM:
CT PELVIS WITH CONTRAST
TECHNIQUE: Multidetector CT imaging of the pelvis was performed using the
standard protocol following the bolus administration of intravenous
contrast.
CONTRAST:  100mL [PB] IOPAMIDOL ([PB]) INJECTION 61%

[Series 3: pelvis 5.00 br40 s3 · axial · 0.72mm/px · z∈[-1602,-1367]mm · 13 of 55 slices shown, 15 images]
[im 4/55  soft-tissue]
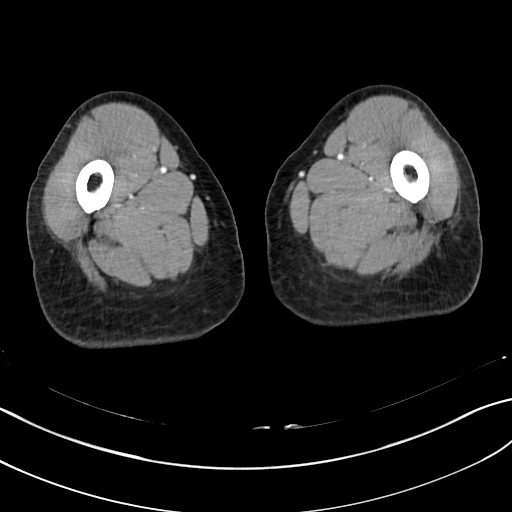
[im 4/55  bone]
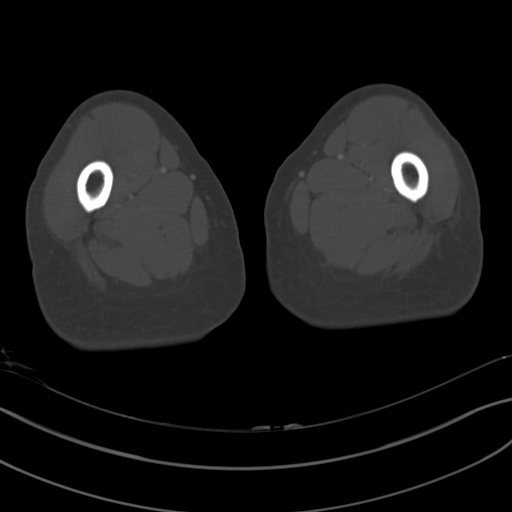
[im 7/55  soft-tissue]
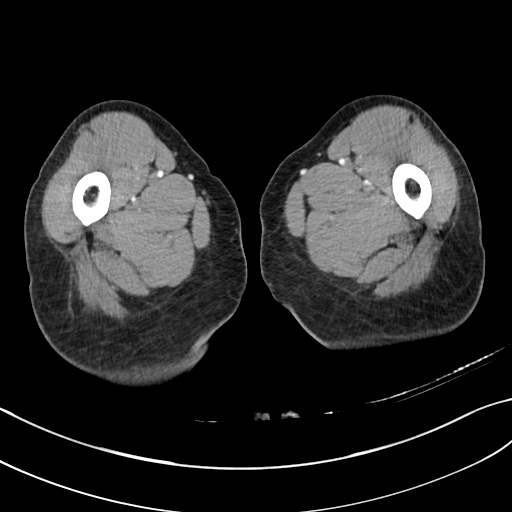
[im 11/55  soft-tissue]
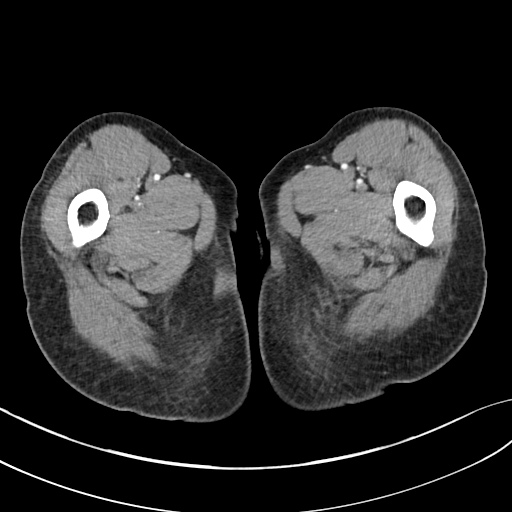
[im 16/55  soft-tissue]
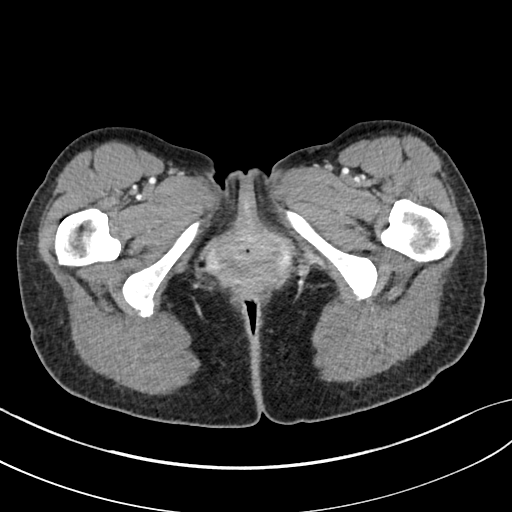
[im 20/55  soft-tissue]
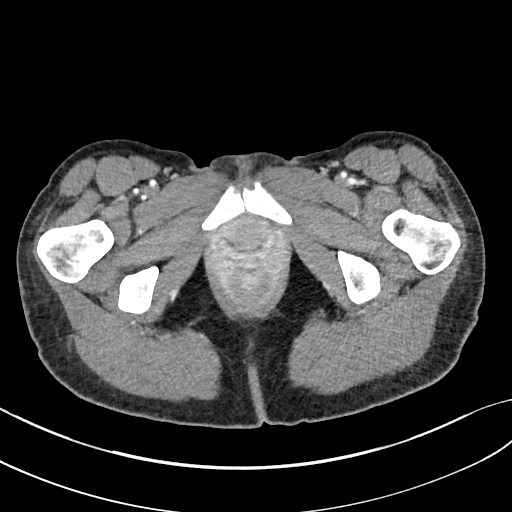
[im 23/55  soft-tissue]
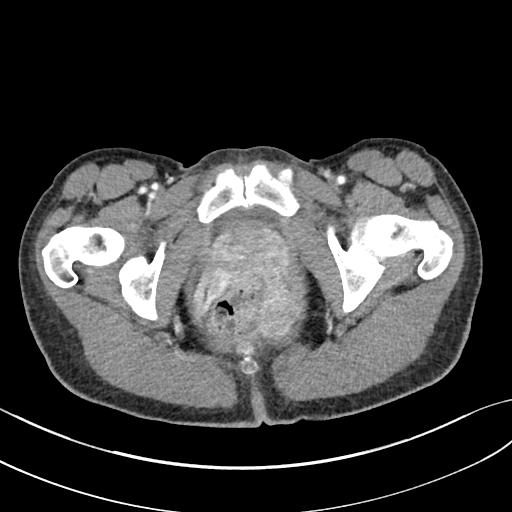
[im 28/55  soft-tissue]
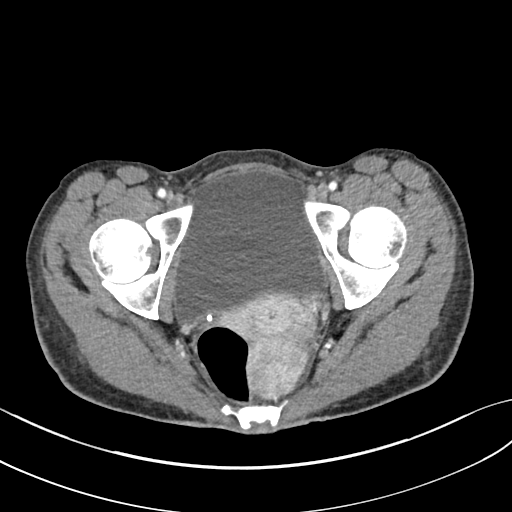
[im 32/55  soft-tissue]
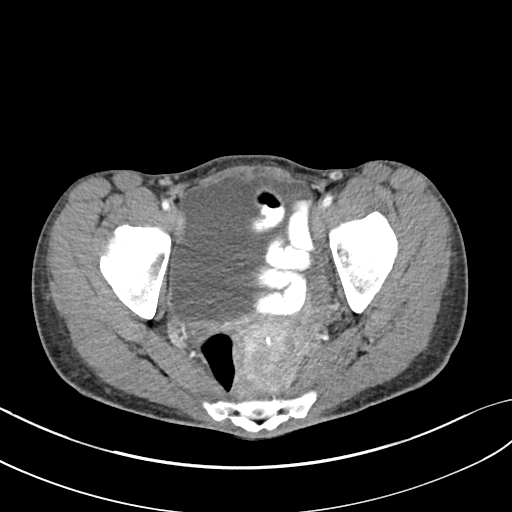
[im 35/55  soft-tissue]
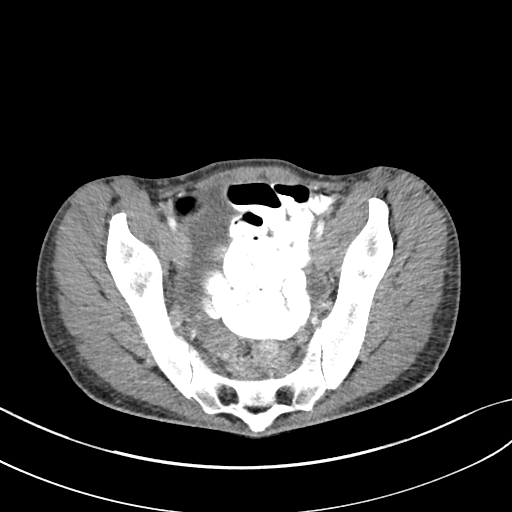
[im 35/55  bone]
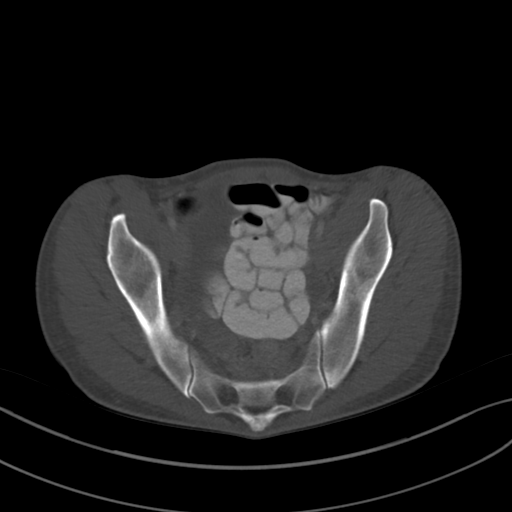
[im 39/55  soft-tissue]
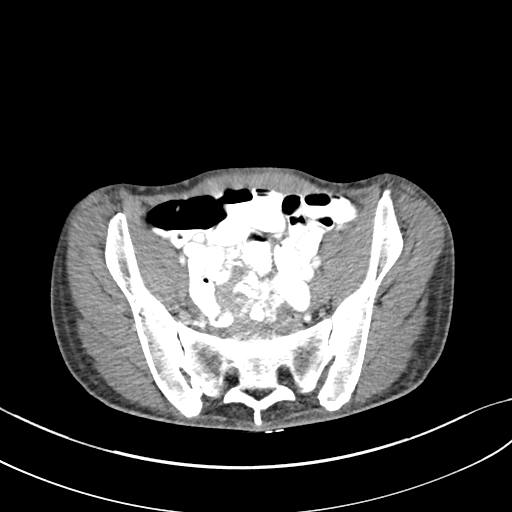
[im 44/55  soft-tissue]
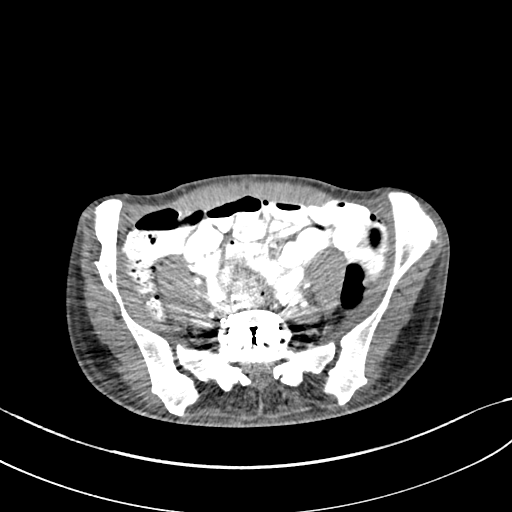
[im 48/55  soft-tissue]
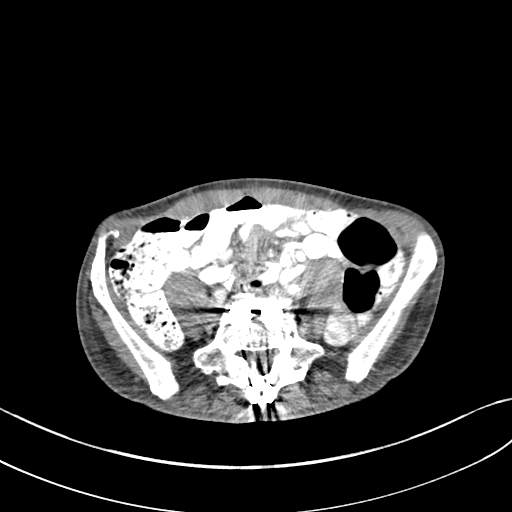
[im 51/55  soft-tissue]
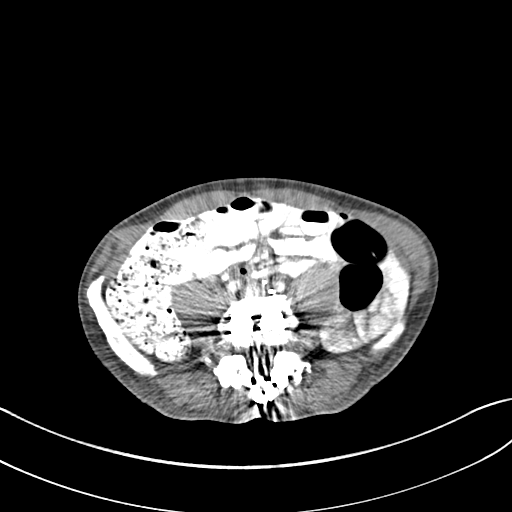

[Series 5: pelvis 2.00 br40 s3 cor · coronal · 0.54mm/px · 3 of 184 slices shown]
[im 62/184  soft-tissue]
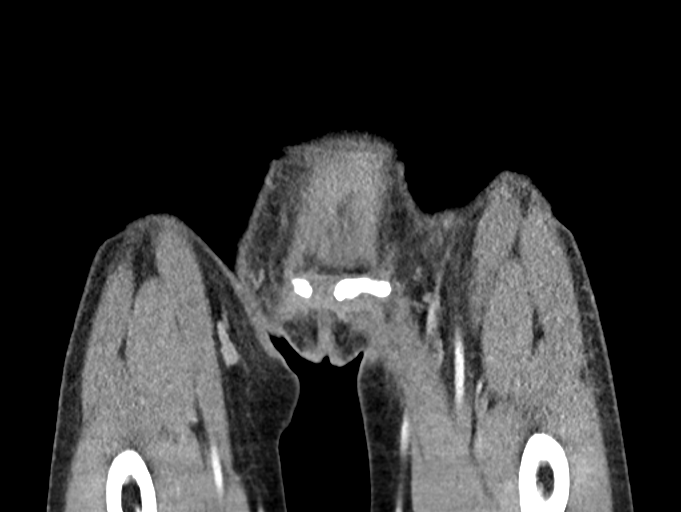
[im 82/184  soft-tissue]
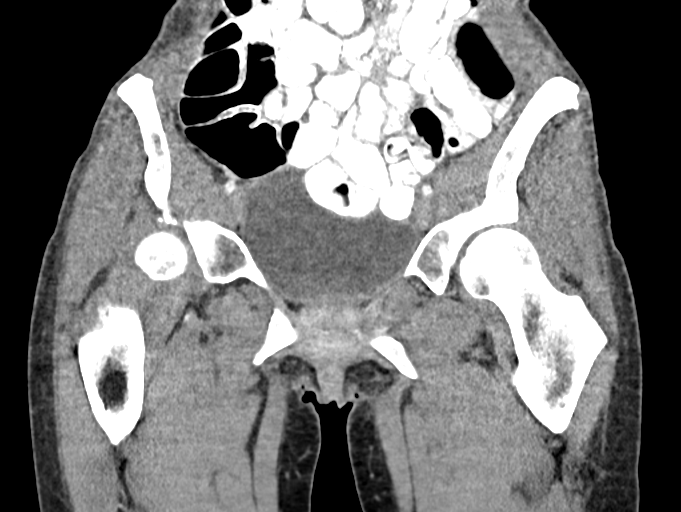
[im 102/184  soft-tissue]
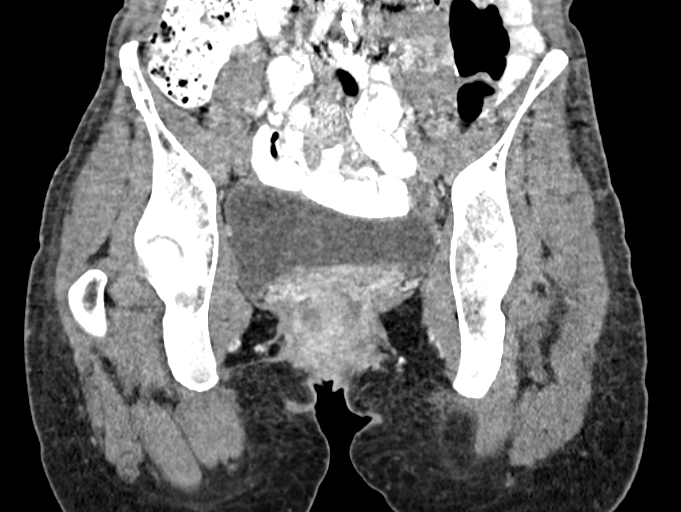

[16 of 46 positions shown; findings below may reference images not displayed]

FINDINGS: Urinary Tract:  No abnormality visualized.

Bowel:  Unremarkable visualized pelvic bowel loops.

Vascular/Lymphatic: No pathologically enlarged lymph nodes. No
significant vascular abnormality seen.

Reproductive: The uterus is retroflexed, unchanged from prior
examination. Intrauterine device in expected position within the
endometrial cavity.

Other:  No abdominal wall hernia identified.  Rectum unremarkable.

Musculoskeletal: Lumbosacral fusion with instrumentation has been
performed. No acute bone abnormality. No lytic or blastic bone
lesion. Deformity of the right iliac crest may relate to remote
trauma or bone graft harvesting.
IMPRESSION: No acute pelvic abnormality identified. No definite radiographic
explanation for the patient's reported symptoms.

Intrauterine device in expected position.

## 2021-06-29 IMAGING — CT CT T SPINE W/O CM
4 of 7 series · 11 of 33 positions shown, 12 images · non-contrast
Comparison: None.

CLINICAL DATA: Upper back pain with arm and hand numbness, tethered
cord syndrome, Guillain-Barre syndrome

EXAM:
CT THORACIC SPINE WITHOUT CONTRAST
TECHNIQUE: Multidetector CT images of the thoracic were obtained using the
standard protocol without intravenous contrast.

[Series 3: t-spine 2.00 br40 s3 · axial · 0.34mm/px · z∈[-1175,-1043]mm · 2 of 199 slices shown]
[im 67/199  bone]
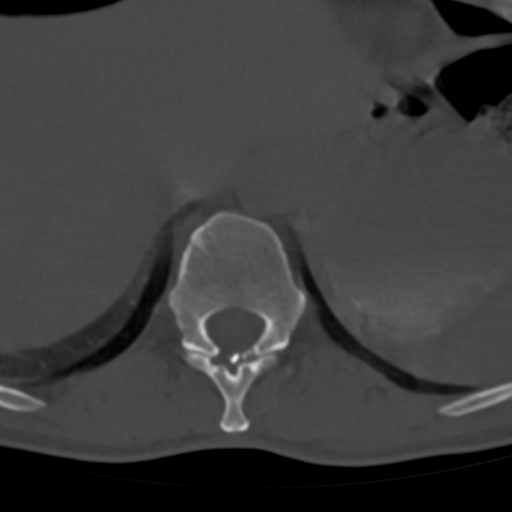
[im 133/199  bone]
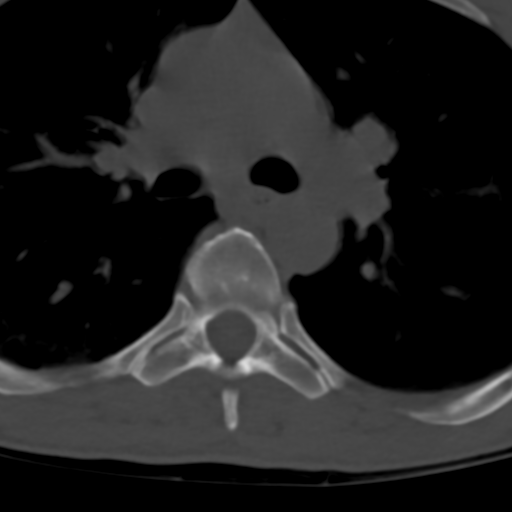

[Series 5: t-spine 2.00 br60 s3 · axial · 0.34mm/px · z∈[-1209,-1011]mm · 3 of 199 slices shown, 4 images (1 of 2)]
[im 50/199  soft-tissue]
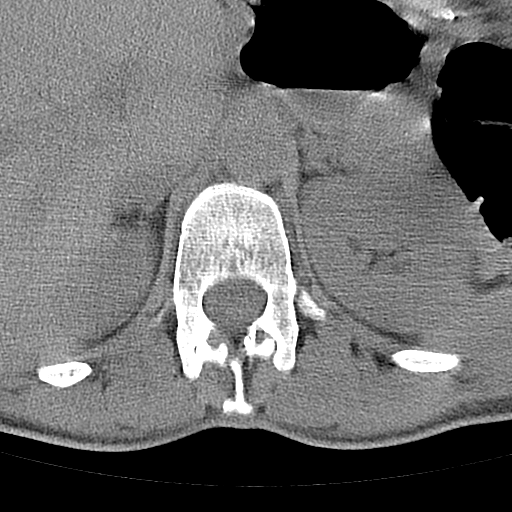
[im 50/199  bone]
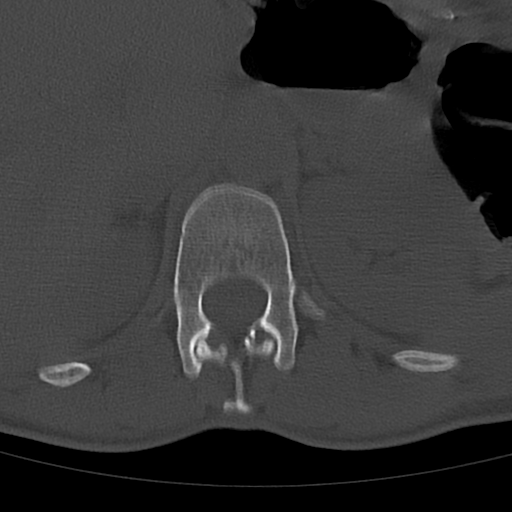
[im 100/199  bone]
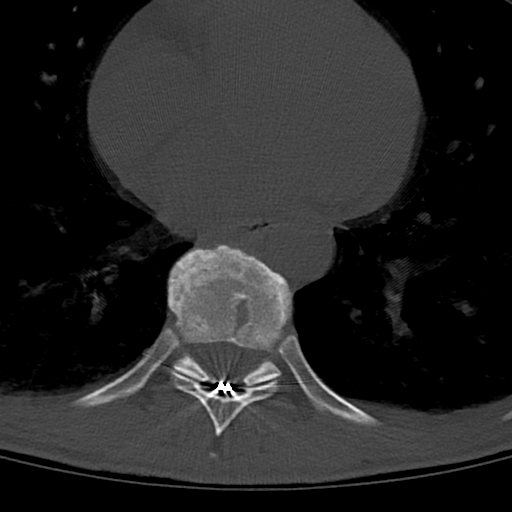
[im 149/199  bone]
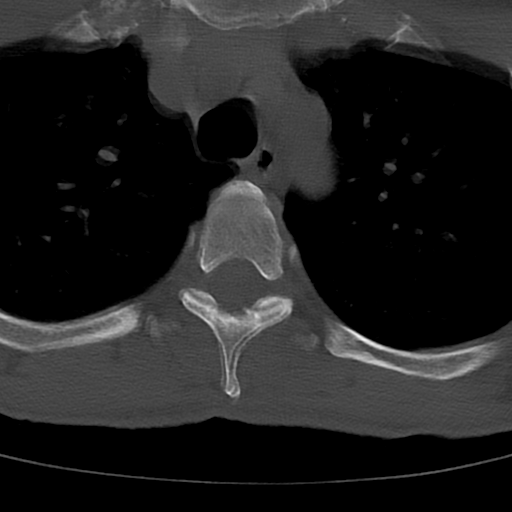

[Series 9: t-spine 2.00 br60 s3 · coronal · 0.32mm/px · 3 of 75 slices shown (2 of 2)]
[im 24/75  bone]
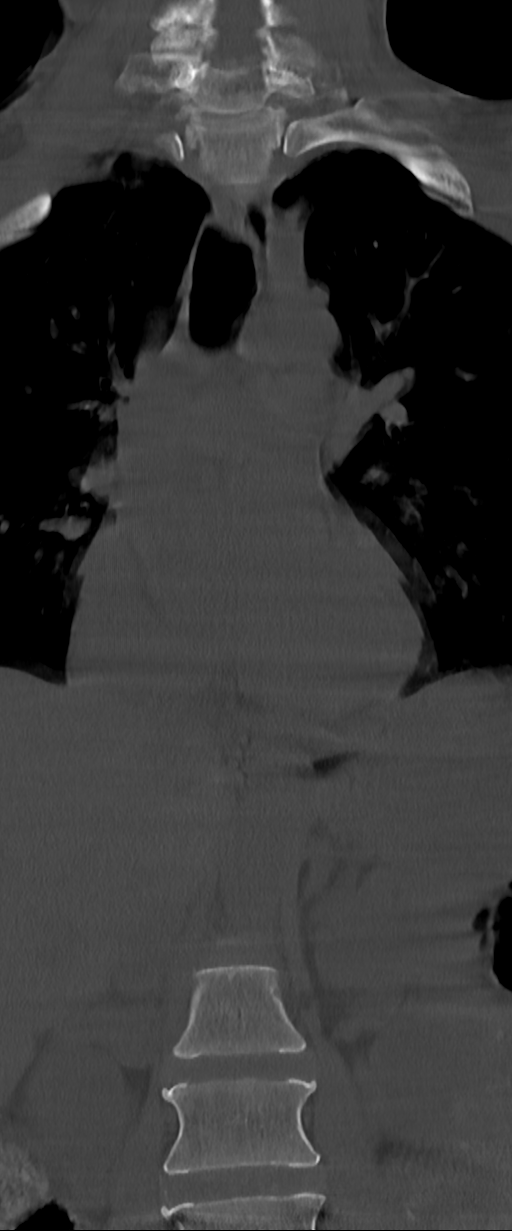
[im 47/75  bone]
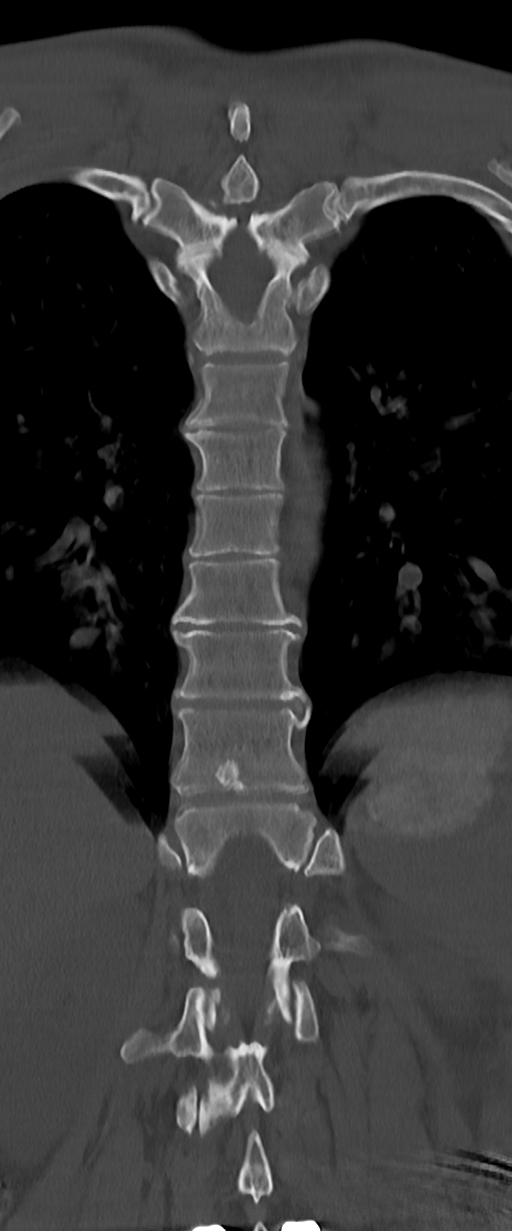
[im 70/75  bone]
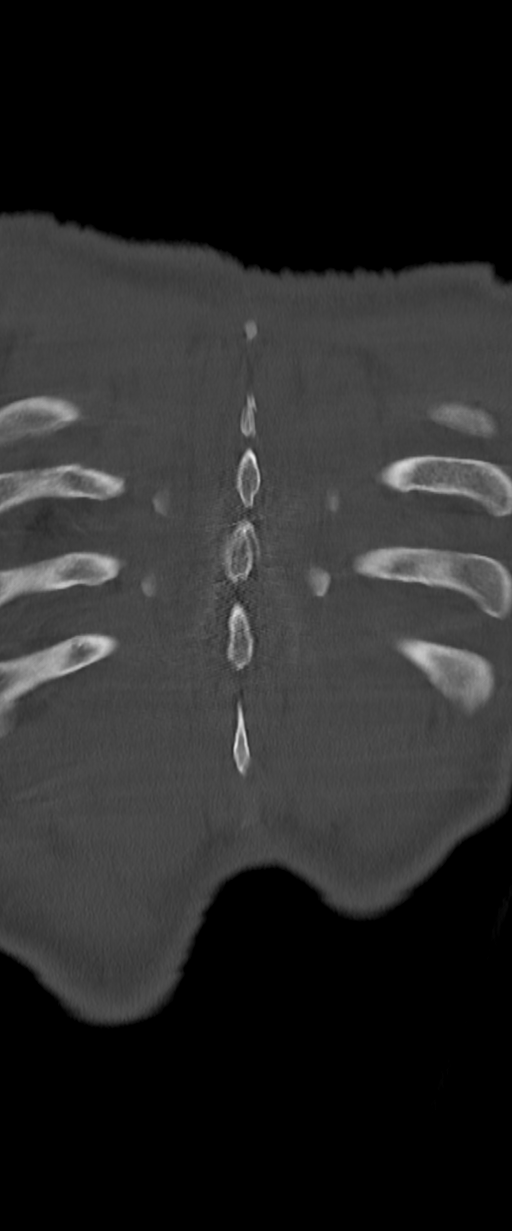

[Series 12: t-spine 2.00 br60 s3 true axial bone · axial · 0.31mm/px · z∈[-1179,-1030]mm · 3 of 205 slices shown]
[im 52/205  bone]
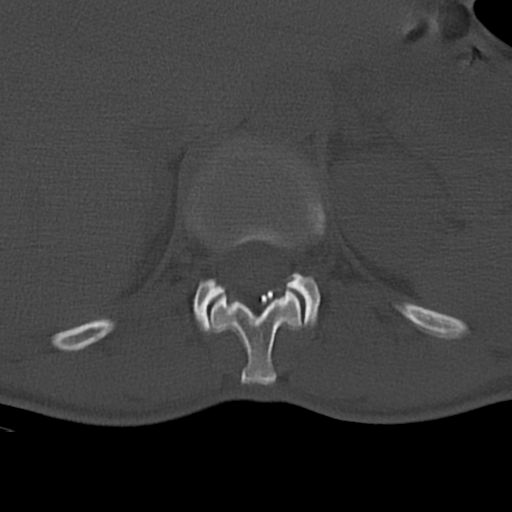
[im 103/205  bone]
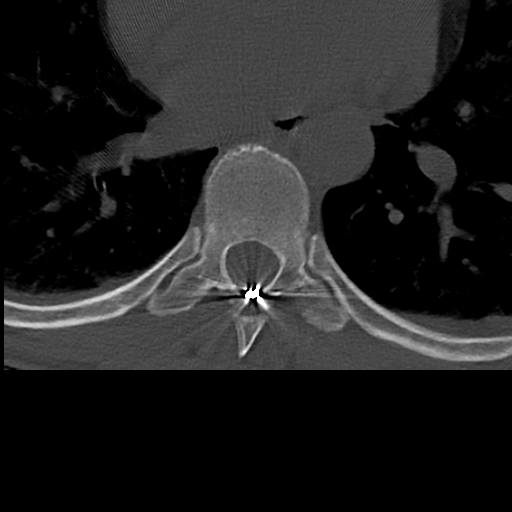
[im 154/205  bone]
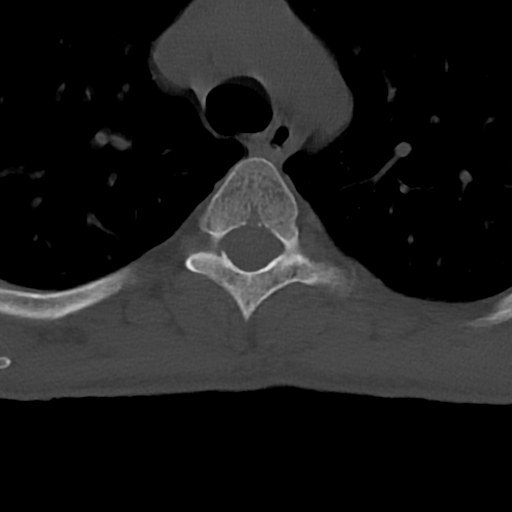

[11 of 33 positions shown; findings below may reference images not displayed]

FINDINGS: Mild motion artifact slightly limits evaluation of the cervical
spine, particularly at the cervicothoracic junction.

Alignment: Normal thoracic kyphosis. No listhesis. Incidentally
noted is mild anterolisthesis of C6-7 with superimposed changes of
advanced degenerative disc disease at this level.

Vertebrae: Schmorl's node noted at T10 involving the inferior
endplate. Tiny endplate fractures noted within T7, T8, T9, and T11
without significant associated loss of height. No acute fracture of
the thoracic spine. Vertebral body height has been preserved. No
suspicious lytic or blastic bone lesion identified.

Paraspinal and other soft tissues: Dorsal column stimulator device
noted with the battery pack within the subcutaneous soft tissues of
the left flank, accessing the spinal canal at T11-12, and extending
dorsally with its leads positioned at T6-T9. The paraspinal soft
tissues are otherwise unremarkable.

Disc levels: There is mild intervertebral disc space narrowing and
endplate remodeling seen at T2-T11 in keeping with changes of mild
degenerative disc disease. No significant associated canal stenosis
or neuroforaminal narrowing is identified. No significant facet
arthrosis.
IMPRESSION: Mild diffuse degenerative disc disease throughout the thoracic
spine. No acute fracture or listhesis.

## 2021-06-29 MED ORDER — IOPAMIDOL (ISOVUE-300) INJECTION 61%
100.0000 mL | Freq: Once | INTRAVENOUS | Status: AC | PRN
  Administered 2021-06-29: 100 mL via INTRAVENOUS

## 2021-07-27 ENCOUNTER — Other Ambulatory Visit: Payer: Self-pay | Admitting: Physician Assistant

## 2021-07-27 DIAGNOSIS — M545 Low back pain, unspecified: Secondary | ICD-10-CM

## 2021-07-27 DIAGNOSIS — G8929 Other chronic pain: Secondary | ICD-10-CM

## 2021-08-17 ENCOUNTER — Ambulatory Visit
Admission: RE | Admit: 2021-08-17 | Discharge: 2021-08-17 | Disposition: A | Discharge status: Home / Self Care | Payer: BC Managed Care – PPO | Source: Ambulatory Visit | Attending: Physician Assistant | Admitting: Physician Assistant

## 2021-08-17 ENCOUNTER — Other Ambulatory Visit: Payer: Self-pay

## 2021-08-17 DIAGNOSIS — M545 Low back pain, unspecified: Secondary | ICD-10-CM

## 2021-08-17 DIAGNOSIS — G8929 Other chronic pain: Secondary | ICD-10-CM

## 2021-08-22 ENCOUNTER — Ambulatory Visit
Admission: RE | Admit: 2021-08-22 | Discharge: 2021-08-22 | Disposition: A | Discharge status: Home / Self Care | Payer: BC Managed Care – PPO | Source: Ambulatory Visit | Attending: Physician Assistant | Admitting: Physician Assistant

## 2021-08-22 ENCOUNTER — Other Ambulatory Visit: Payer: Self-pay

## 2021-08-22 IMAGING — MR MR LUMBAR SPINE WO/W CM
4 of 8 series · 25 of 48 positions shown · IV contrast (multihance)
Comparison: MRI lumbar spine dated [DATE].

CLINICAL DATA: Chronic low back pain.  History of prior surgery.

EXAM:
MRI LUMBAR SPINE WITHOUT AND WITH CONTRAST
TECHNIQUE: Multiplanar and multiecho pulse sequences of the lumbar spine were
obtained without and with intravenous contrast.
CONTRAST:  13mL MULTIHANCE GADOBENATE DIMEGLUMINE 529 MG/ML IV SOLN

[Series 5: T2 · sagittal · 4.0mm · 1.09mm/px · 4 of 17 slices shown (1 of 2)]
[im 1/17]
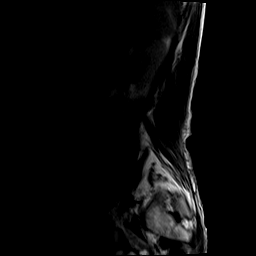
[im 6/17]
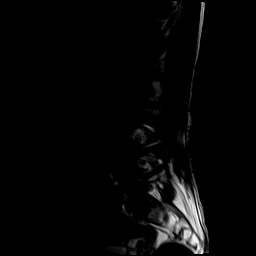
[im 11/17]
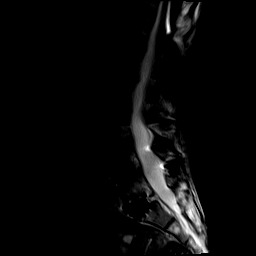
[im 17/17]
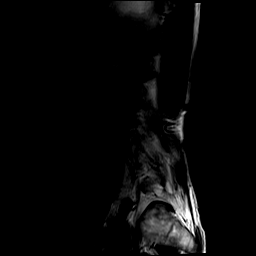

[Series 6: T1 · sagittal · 4.0mm · 1.09mm/px · 4 of 17 slices shown (1 of 2)]
[im 1/17]
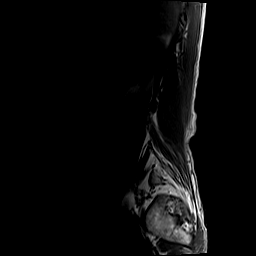
[im 6/17]
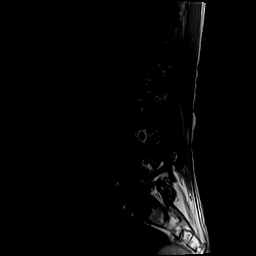
[im 11/17]
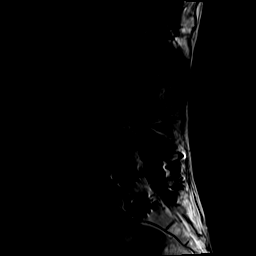
[im 17/17]
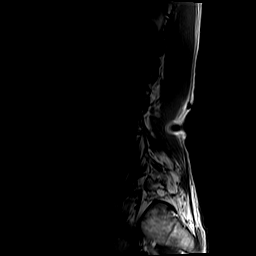

[Series 7: T2 · axial · 4.0mm · 0.39mm/px · z∈[-102,+113]mm · 9 of 41 slices shown (2 of 2)]
[im 1/41]
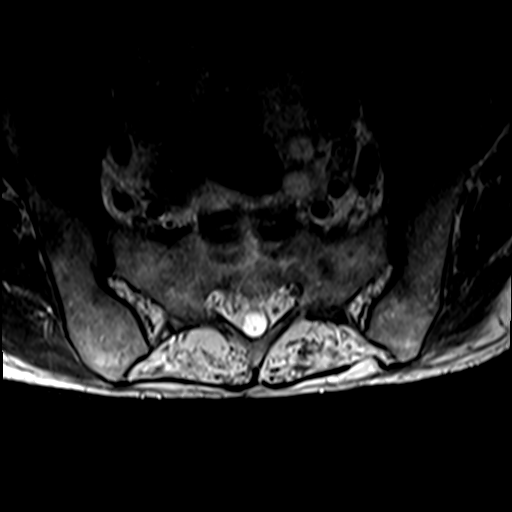
[im 6/41]
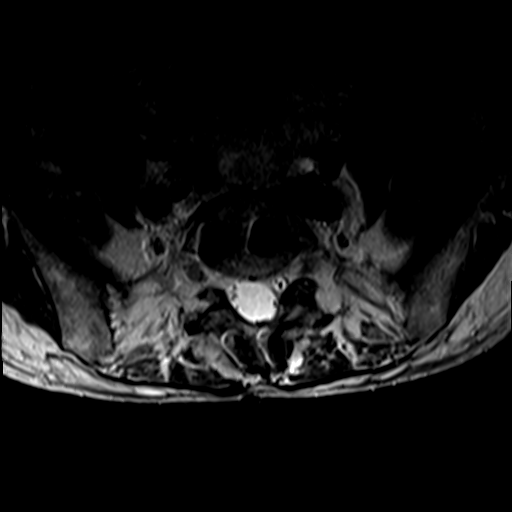
[im 11/41]
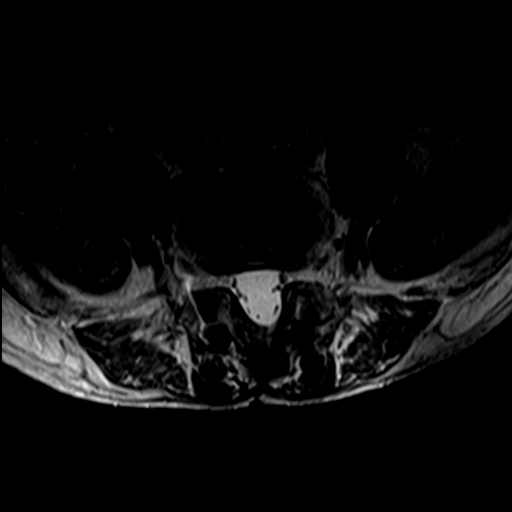
[im 16/41]
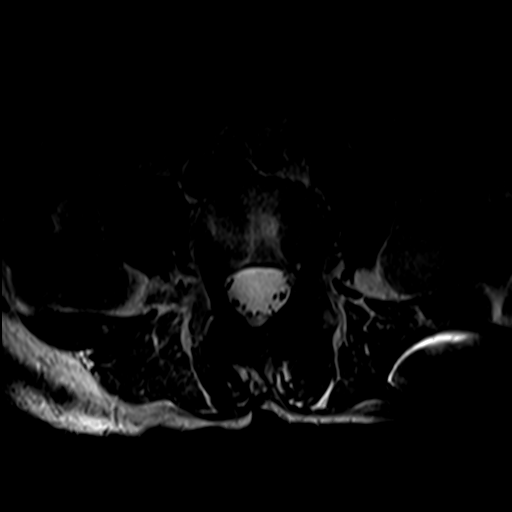
[im 21/41]
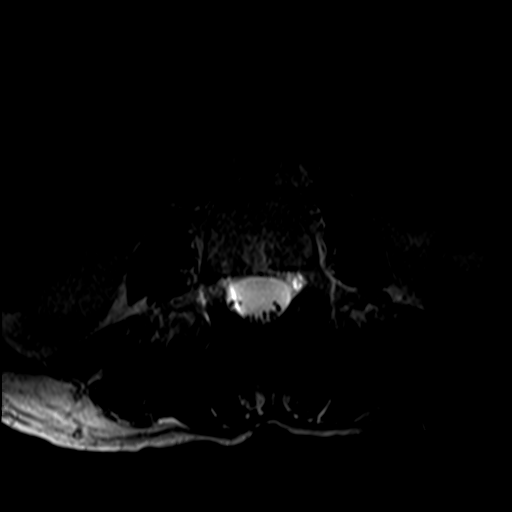
[im 26/41]
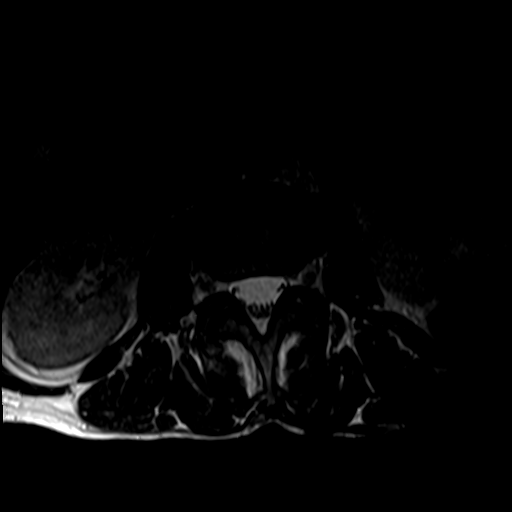
[im 31/41]
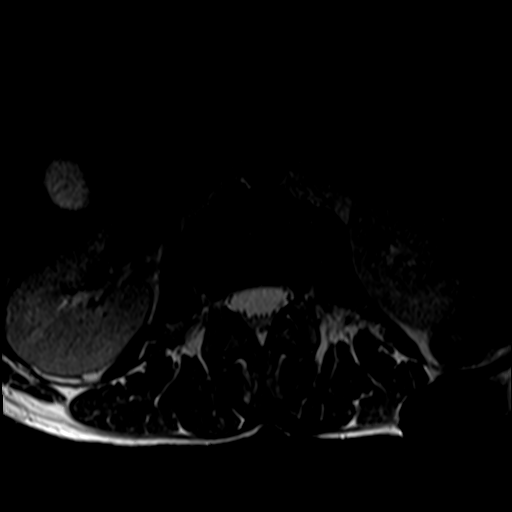
[im 36/41]
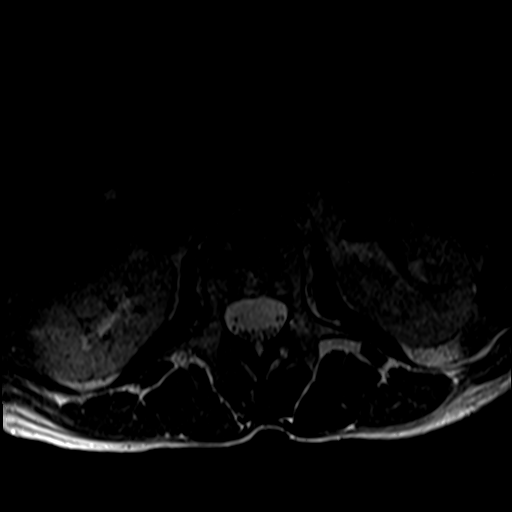
[im 41/41]
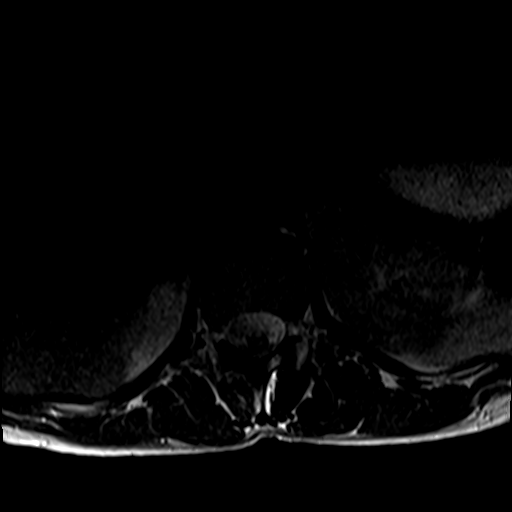

[Series 8: T1 · axial · 4.0mm · 0.39mm/px · z∈[-102,+89]mm · 8 of 41 slices shown (2 of 2)]
[im 1/41]
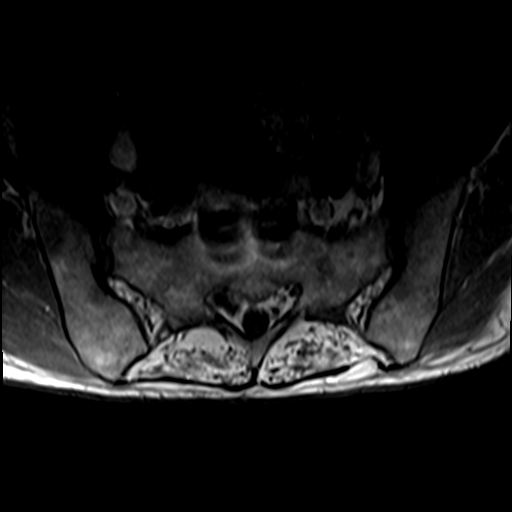
[im 6/41]
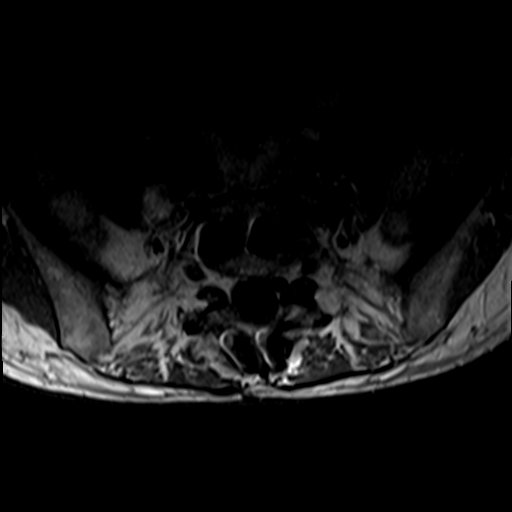
[im 11/41]
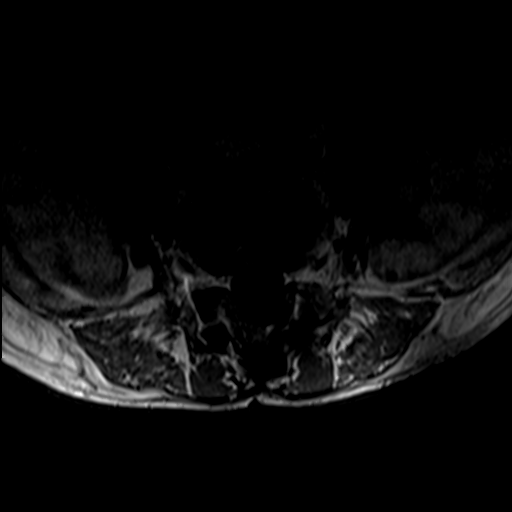
[im 16/41]
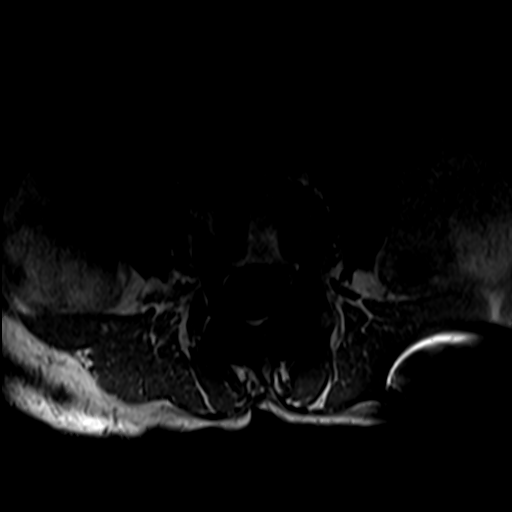
[im 21/41]
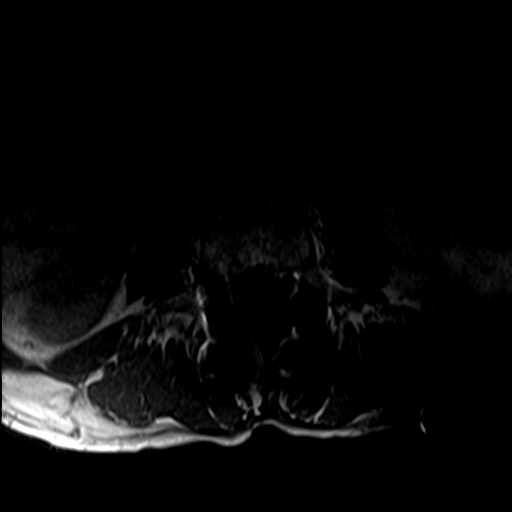
[im 26/41]
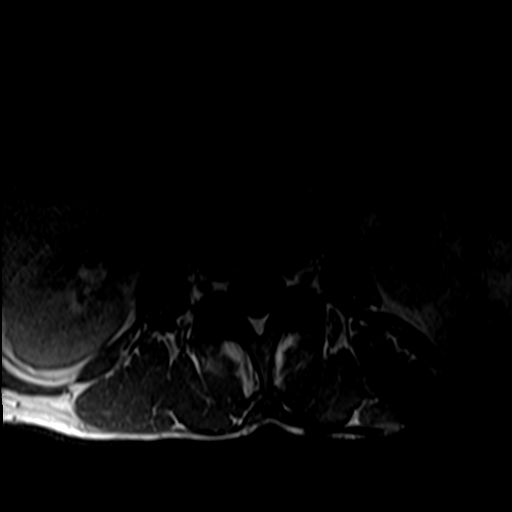
[im 31/41]
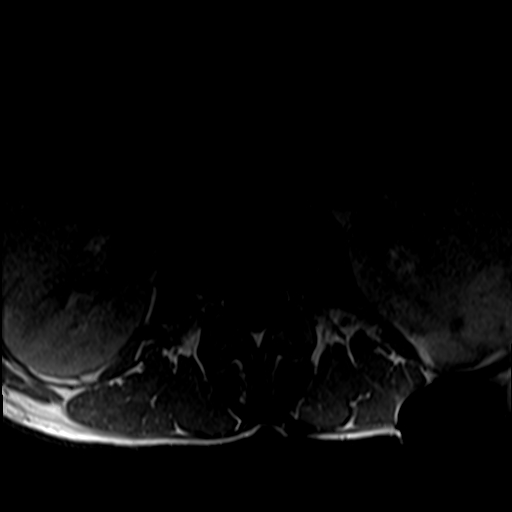
[im 36/41]
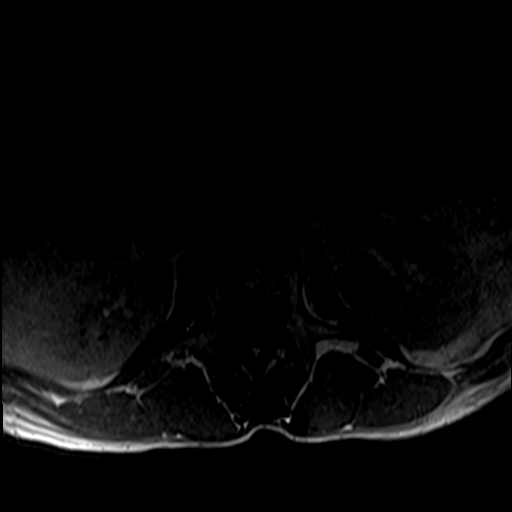

[25 of 48 positions shown; findings below may reference images not displayed]

FINDINGS: Segmentation: The lowest fusion level is labeled L5-S1, similar to
prior studies.

Alignment:  Physiologic.

Vertebrae: Prior L3-S1 fusion. No fracture, evidence of discitis, or
suspicious bone lesion.

Conus medullaris and cauda equina: Conus extends to the L1 level.
Conus and cauda equina appear normal. No intradural enhancement.

Paraspinal and other soft tissues: Negative.

Disc levels:

T12-L1:  Negative.

L1-L2:  Unchanged minimal disc bulging.  No stenosis.

L2-L3: Unchanged mild disc bulging and bilateral facet arthropathy.
No stenosis.

L3-L4:  Prior PLIF.  No residual stenosis.

L4-L5:  Prior PLIF.  No residual stenosis.

L5-S1: Prior interbody fusion. Similar left subarticular
postsurgical changes prior discectomy with residual granulation
tissue. Unchanged mild bilateral facet arthropathy. No stenosis.
IMPRESSION: 1. Prior L3-S1 fusion with unchanged mild adjacent segment disease
at L2-L3. No stenosis or impingement.

## 2021-08-22 MED ORDER — GADOBENATE DIMEGLUMINE 529 MG/ML IV SOLN
13.0000 mL | Freq: Once | INTRAVENOUS | Status: AC | PRN
  Administered 2021-08-22: 13 mL via INTRAVENOUS

## 2022-01-01 ENCOUNTER — Other Ambulatory Visit: Payer: Self-pay | Admitting: Physician Assistant

## 2022-01-01 DIAGNOSIS — Z981 Arthrodesis status: Secondary | ICD-10-CM

## 2022-01-02 ENCOUNTER — Other Ambulatory Visit: Payer: Self-pay | Admitting: Physician Assistant

## 2022-01-02 DIAGNOSIS — Q068 Other specified congenital malformations of spinal cord: Secondary | ICD-10-CM

## 2022-01-07 ENCOUNTER — Other Ambulatory Visit: Payer: BC Managed Care – PPO

## 2022-01-07 ENCOUNTER — Ambulatory Visit
Admission: RE | Admit: 2022-01-07 | Discharge: 2022-01-07 | Disposition: A | Discharge status: Home / Self Care | Payer: BC Managed Care – PPO | Source: Ambulatory Visit | Attending: Physician Assistant | Admitting: Physician Assistant

## 2022-01-07 ENCOUNTER — Other Ambulatory Visit: Payer: Self-pay

## 2022-01-07 ENCOUNTER — Other Ambulatory Visit: Payer: Self-pay | Admitting: Physician Assistant

## 2022-01-07 DIAGNOSIS — Z981 Arthrodesis status: Secondary | ICD-10-CM

## 2022-01-07 DIAGNOSIS — Q068 Other specified congenital malformations of spinal cord: Secondary | ICD-10-CM

## 2022-01-07 IMAGING — CT CT L SPINE W/O CM
1 of 7 series · 5 of 14 positions shown, 7 images · non-contrast
Comparison: [DATE]

CLINICAL DATA: The upper back pain with numbness in the arms and
hands. Tethered cord syndrome. Guillain-Barre syndrome. Lumbar
fusion



[Series 2: l-spine 2.0 st · axial · 0.30mm/px · z∈[-385,-201]mm · 5 of 138 slices shown, 7 images]
[im 23/138  soft-tissue]
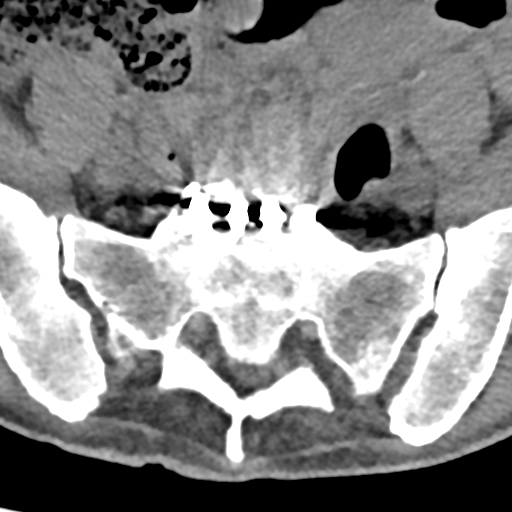
[im 23/138  bone]
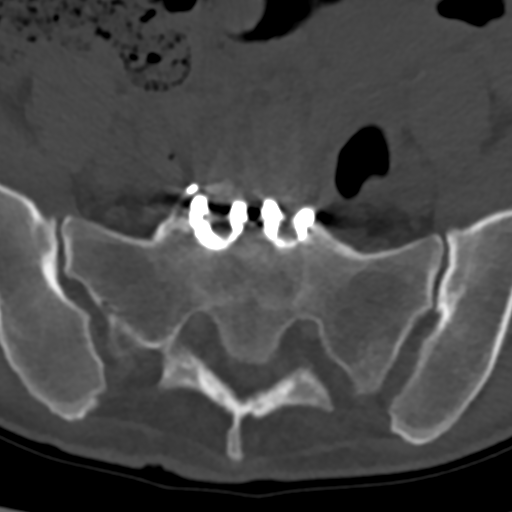
[im 46/138  bone]
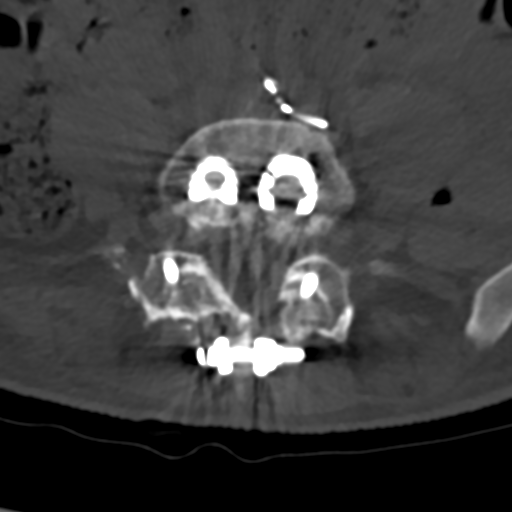
[im 69/138  bone]
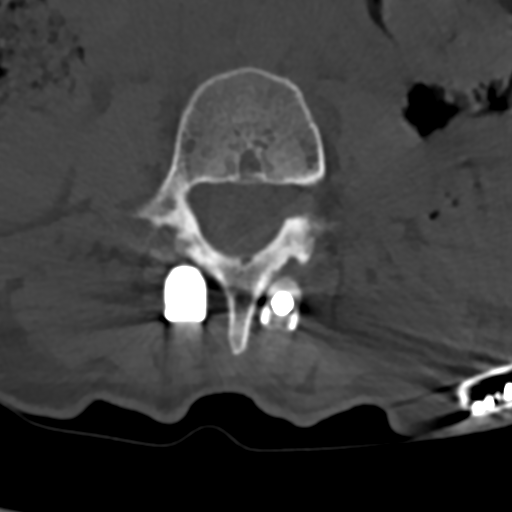
[im 92/138  bone]
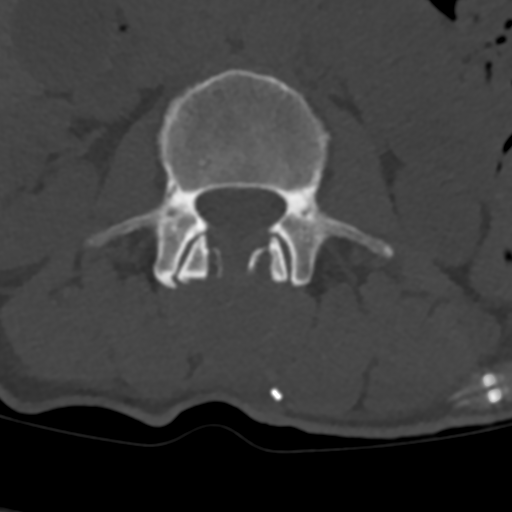
[im 115/138  soft-tissue]
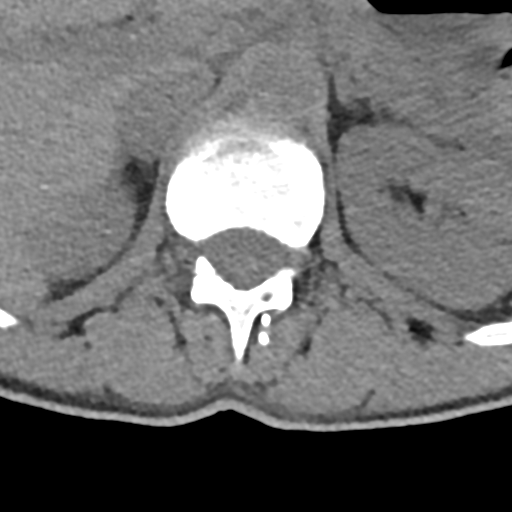
[im 115/138  bone]
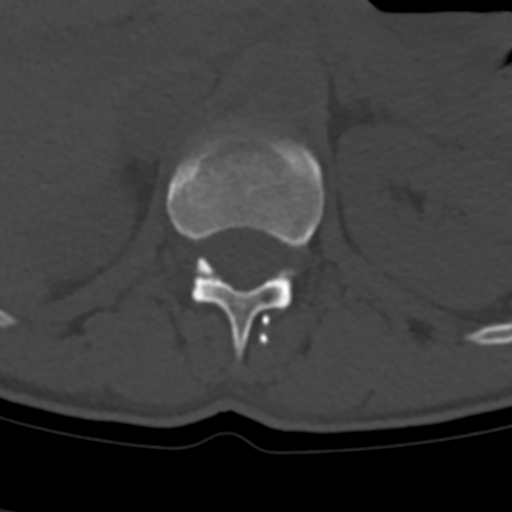

[5 of 14 positions shown; findings below may reference images not displayed]

FINDINGS: Segmentation: 6 non rib-bearing lumbar region vertebrae, the lowest
numbered L5 based on comparison.

Alignment: Unremarkable

Vertebrae: L3-4 to L5-S1 fusion with solid arthrodesis seen at each
level. No evidence of hardware failure. Vacuum phenomenon in the
sacroiliac joints.

Paraspinal and other soft tissues: No evidence of perispinal mass or
inflammation. Spinal stimulator leads entering the posterior canal
at T11-12.

Disc levels: Mild disc space narrowing at L1-2 and L2-3 with L2-3
annular calcification. Mild facet spurring at L2-3. No bony
impingement.
IMPRESSION: 1. L3-S1 solid fusion.
2. No bony impingement seen throughout the lumbar spine.

## 2022-01-07 IMAGING — MR MR LUMBAR SPINE WO/W CM
5 of 8 series · 26 of 48 positions shown · IV contrast (12ML multihance)
Comparison: [DATE]

CLINICAL DATA: Diffuse spine pain radiating into all extremities.
Surgery for tethered cord [DATE].

EXAM:
MRI LUMBAR SPINE WITHOUT AND WITH CONTRAST
TECHNIQUE: Multiplanar and multiecho pulse sequences of the lumbar spine were
obtained without and with intravenous contrast.
CONTRAST:  12mL MULTIHANCE GADOBENATE DIMEGLUMINE 529 MG/ML IV SOLN

[Series 5: T1 · sagittal · 4.0mm · 1.09mm/px · 3 of 17 slices shown (1 of 2)]
[im 1/17]
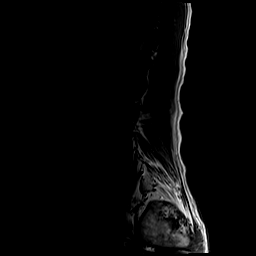
[im 9/17]
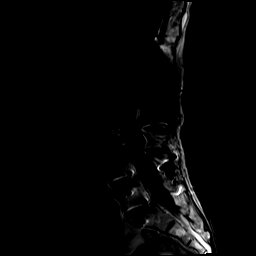
[im 17/17]
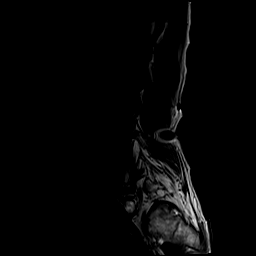

[Series 7: T2 · axial · 4.0mm · 0.39mm/px · z∈[-180,+62]mm · 8 of 42 slices shown (1 of 2)]
[im 1/42]
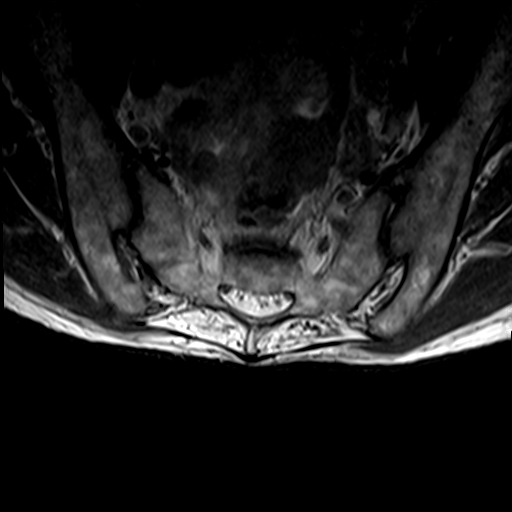
[im 5/42]
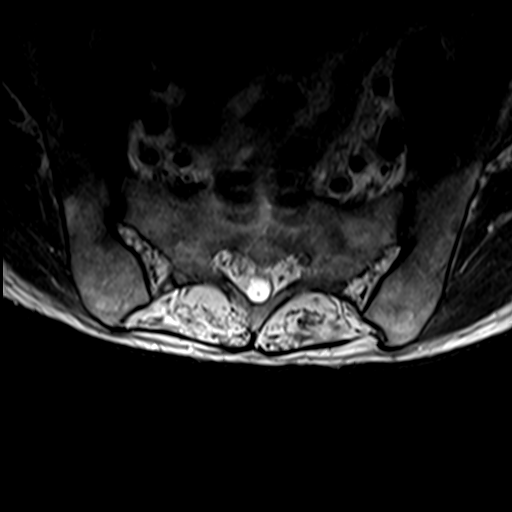
[im 14/42]
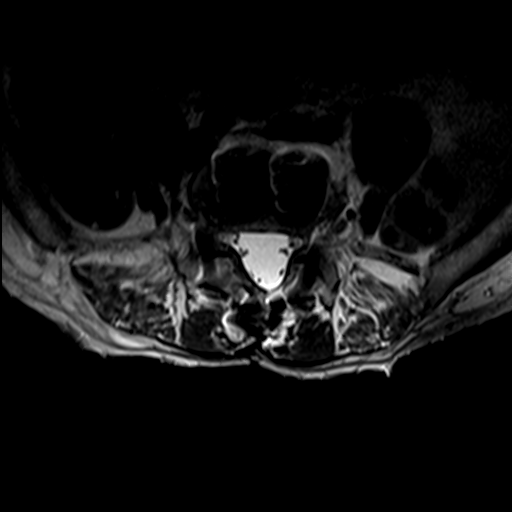
[im 19/42]
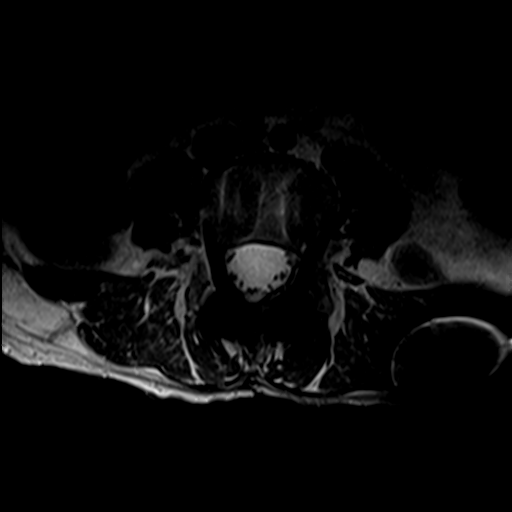
[im 23/42]
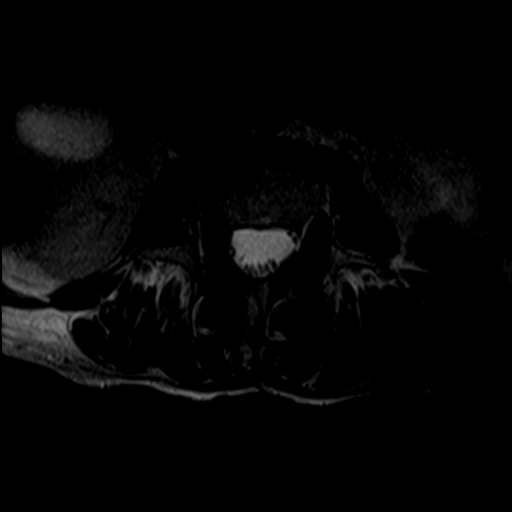
[im 28/42]
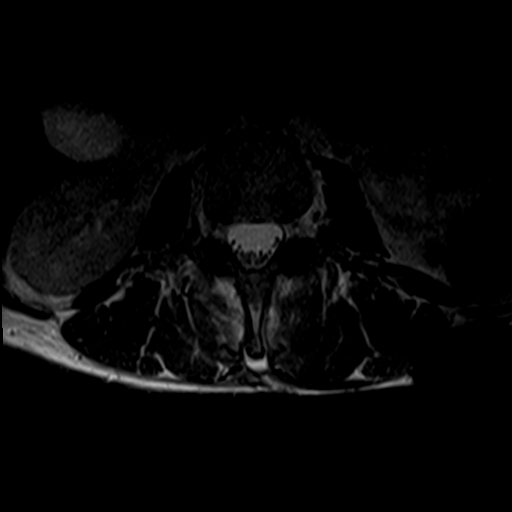
[im 37/42]
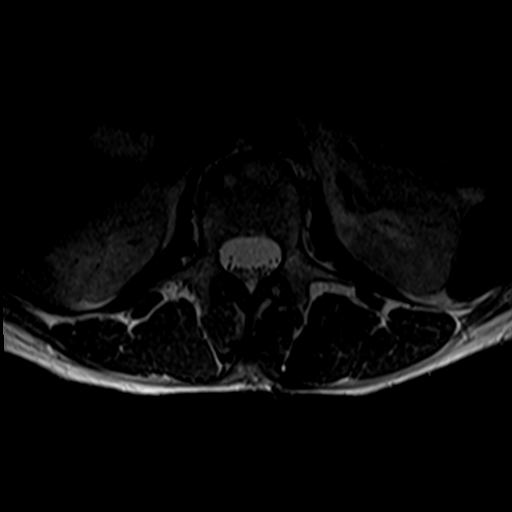
[im 42/42]
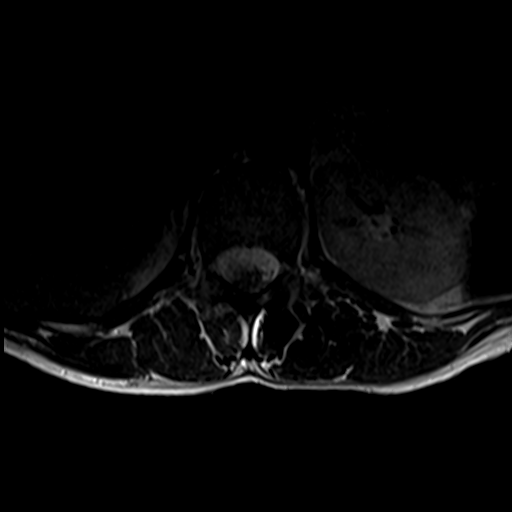

[Series 8: T1 · axial · 4.0mm · 0.39mm/px · z∈[-180,+62]mm · 8 of 42 slices shown (2 of 2)]
[im 1/42]
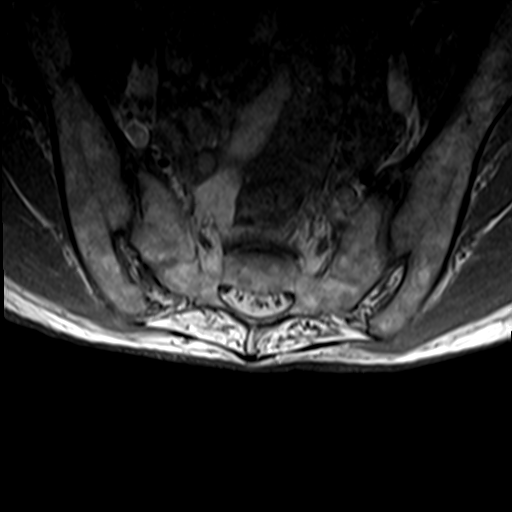
[im 5/42]
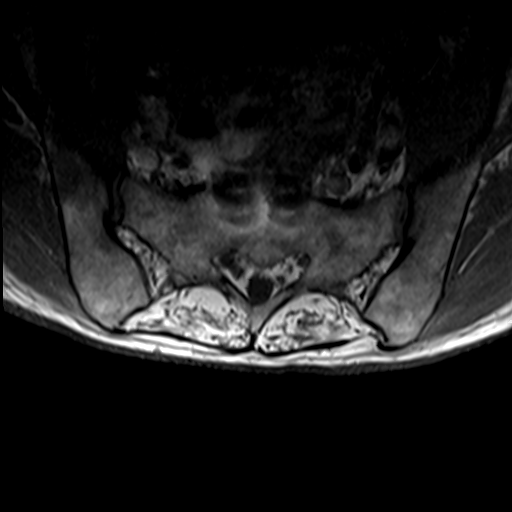
[im 14/42]
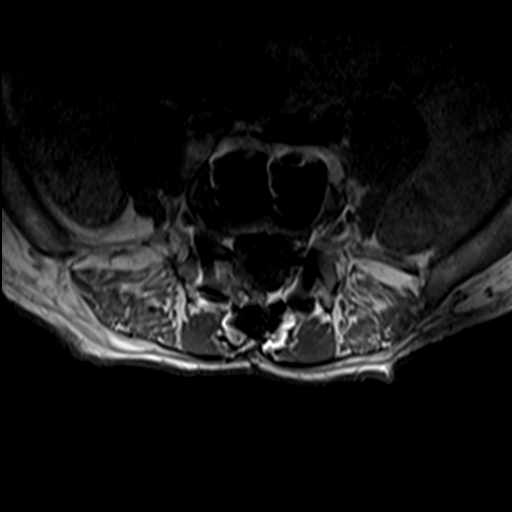
[im 19/42]
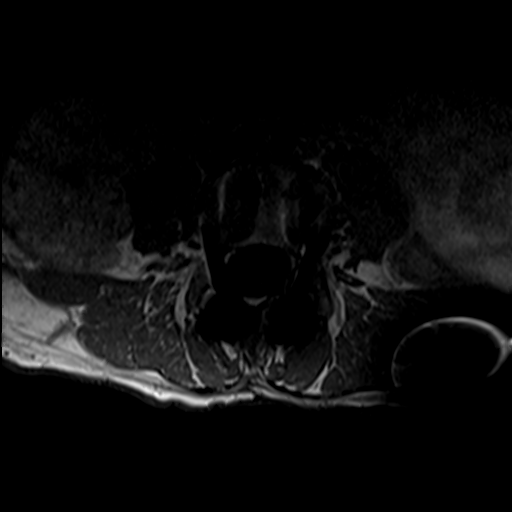
[im 23/42]
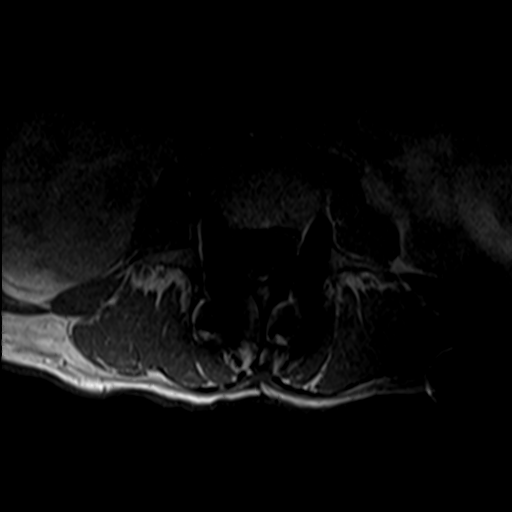
[im 28/42]
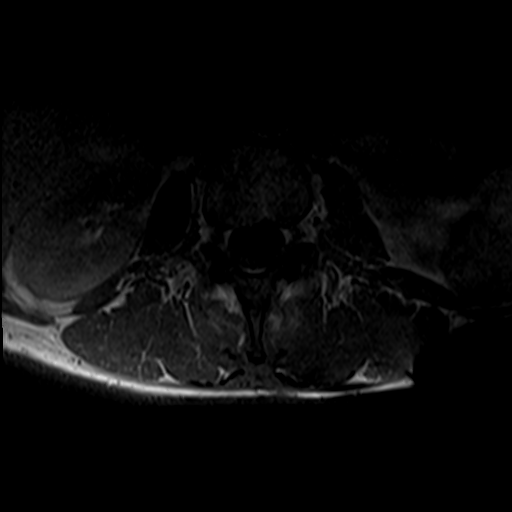
[im 37/42]
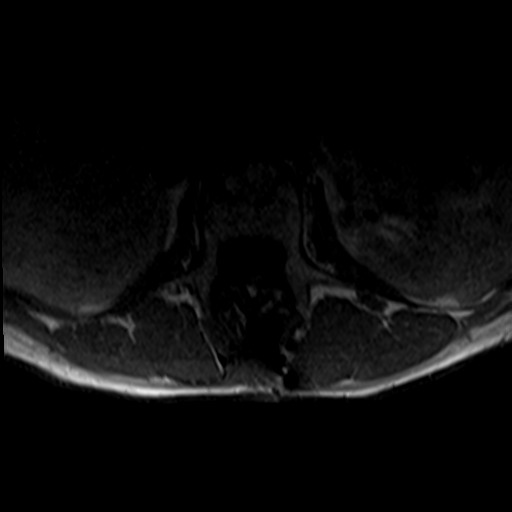
[im 42/42]
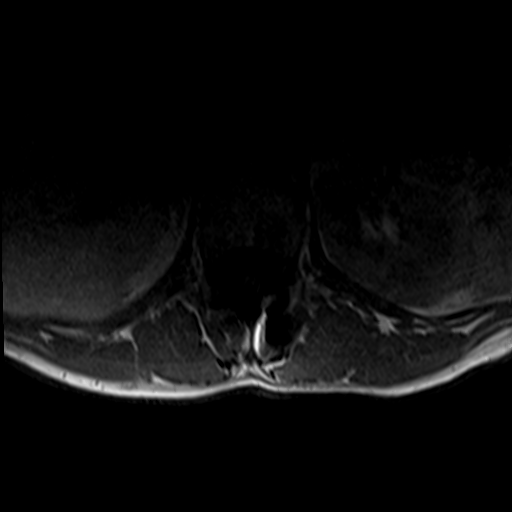

[Series 9: T2 · sagittal · 4.0mm · 1.09mm/px · 4 of 17 slices shown (2 of 2)]
[im 1/17]
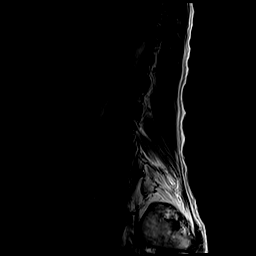
[im 6/17]
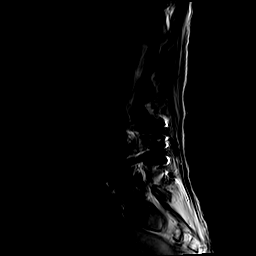
[im 11/17]
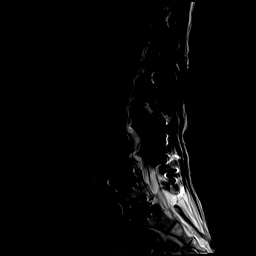
[im 17/17]
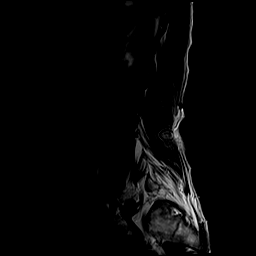

[Series 10: T1 fat-sat · sagittal · 4.0mm · 1.09mm/px · 3 of 17 slices shown]
[im 1/17]
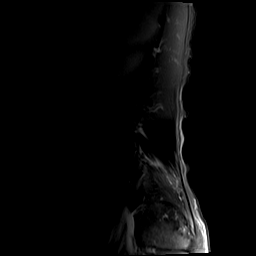
[im 6/17]
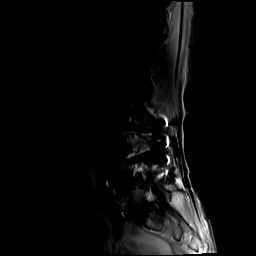
[im 11/17]
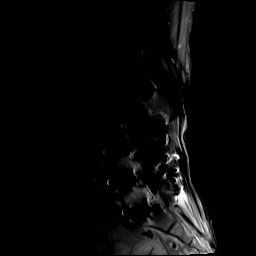

[26 of 48 positions shown; findings below may reference images not displayed]

FINDINGS: Segmentation: 6 lumbar type vertebrae by CT, the lowest level
numbered L5-S1 based on priors.

Alignment:  Unremarkable

Vertebrae: L3-S1 fusion as evaluated by prior CT. No fracture,
discitis, or aggressive bone lesion. Interval laminotomy at L1-2.

Conus medullaris and cauda equina: Conus extends to the L1 level.
The conus appears normal. No evidence of tethering mass. Stable, non
clumped appearance of the cauda equina.

Paraspinal and other soft tissues: Expected postoperative fatty
atrophy and scarring. Expected granulation tissue/fluid at
laminotomy site of L1-2. Spinal stimulator leads not primarily
assessed.

Disc levels:

T12- L1: Unremarkable.

L1-L2: Mild disc space narrowing and bulging.  Mild facet spurring

L2-L3: Foraminal predominant disc bulging and mild facet spurring

L3-L4: Lumbar fusion without impingement

L4-L5: Lumbar fusion without impingement

L5-S1:Lumbar fusion without impingement
IMPRESSION: 1. Interval laminotomy at L1-2. No adverse change since [DATE]. Mild degeneration at L1-2 and L2-3 without impingement.
3. L3-S1 fusion without recurrent impingement.
4. Normal appearance of the conus.

## 2022-01-07 MED ORDER — GADOBENATE DIMEGLUMINE 529 MG/ML IV SOLN
12.0000 mL | Freq: Once | INTRAVENOUS | Status: AC | PRN
  Administered 2022-01-07: 12 mL via INTRAVENOUS
